# Patient Record
Sex: Female | Born: 1975 | Race: White | Hispanic: No | Smoking: Former smoker
Health system: Southern US, Community
[De-identification: ages and names within clinical notes are randomized; demographics above are authoritative.]

## PROBLEM LIST (undated history)

## (undated) DIAGNOSIS — F32A Depression, unspecified: Secondary | ICD-10-CM

## (undated) DIAGNOSIS — F329 Major depressive disorder, single episode, unspecified: Secondary | ICD-10-CM

## (undated) DIAGNOSIS — F250 Schizoaffective disorder, bipolar type: Secondary | ICD-10-CM

## (undated) HISTORY — PX: TONSILLECTOMY AND ADENOIDECTOMY: SUR1326

## (undated) HISTORY — PX: OVARIAN CYST REMOVAL: SHX89

## (undated) HISTORY — PX: APPENDECTOMY: SHX54

---

## 1999-09-17 HISTORY — PX: CHOLECYSTECTOMY: SHX55

## 2010-04-09 ENCOUNTER — Emergency Department (HOSPITAL_COMMUNITY): Admission: EM | Admit: 2010-04-09 | Discharge: 2010-04-10 | Payer: Self-pay | Admitting: Emergency Medicine

## 2010-05-30 ENCOUNTER — Emergency Department (HOSPITAL_COMMUNITY): Admission: EM | Admit: 2010-05-30 | Discharge: 2010-05-30 | Payer: Self-pay | Admitting: Emergency Medicine

## 2010-05-31 ENCOUNTER — Emergency Department (HOSPITAL_COMMUNITY): Admission: EM | Admit: 2010-05-31 | Discharge: 2010-06-01 | Payer: Self-pay | Admitting: Emergency Medicine

## 2010-06-01 ENCOUNTER — Ambulatory Visit: Payer: Self-pay | Admitting: Psychiatry

## 2010-06-01 ENCOUNTER — Inpatient Hospital Stay (HOSPITAL_COMMUNITY): Admission: AD | Admit: 2010-06-01 | Discharge: 2010-06-05 | Payer: Self-pay | Admitting: Psychiatry

## 2010-11-29 LAB — URINALYSIS, ROUTINE W REFLEX MICROSCOPIC
Ketones, ur: 15 mg/dL — AB
Ketones, ur: 15 mg/dL — AB
Nitrite: NEGATIVE
Protein, ur: 30 mg/dL — AB
Protein, ur: 30 mg/dL — AB
Urobilinogen, UA: 1 mg/dL (ref 0.0–1.0)
Urobilinogen, UA: 1 mg/dL (ref 0.0–1.0)

## 2010-11-29 LAB — POCT I-STAT, CHEM 8
BUN: 11 mg/dL (ref 6–23)
Calcium, Ion: 1.02 mmol/L — ABNORMAL LOW (ref 1.12–1.32)
Creatinine, Ser: 1 mg/dL (ref 0.4–1.2)
TCO2: 24 mmol/L (ref 0–100)

## 2010-11-29 LAB — DIFFERENTIAL
Basophils Absolute: 0 10*3/uL (ref 0.0–0.1)
Basophils Absolute: 0 10*3/uL (ref 0.0–0.1)
Basophils Relative: 0 % (ref 0–1)
Eosinophils Absolute: 0 10*3/uL (ref 0.0–0.7)
Eosinophils Absolute: 0 10*3/uL (ref 0.0–0.7)
Eosinophils Relative: 0 % (ref 0–5)
Lymphocytes Relative: 11 % — ABNORMAL LOW (ref 12–46)
Lymphs Abs: 1.5 10*3/uL (ref 0.7–4.0)
Monocytes Absolute: 0.8 10*3/uL (ref 0.1–1.0)
Monocytes Relative: 4 % (ref 3–12)
Neutrophils Relative %: 81 % — ABNORMAL HIGH (ref 43–77)
Neutrophils Relative %: 92 % — ABNORMAL HIGH (ref 43–77)

## 2010-11-29 LAB — CBC
HCT: 47.8 % — ABNORMAL HIGH (ref 36.0–46.0)
Hemoglobin: 16.6 g/dL — ABNORMAL HIGH (ref 12.0–15.0)
Hemoglobin: 16.6 g/dL — ABNORMAL HIGH (ref 12.0–15.0)
MCH: 30.2 pg (ref 26.0–34.0)
MCH: 30.6 pg (ref 26.0–34.0)
MCH: 31.2 pg (ref 26.0–34.0)
MCHC: 34.7 g/dL (ref 30.0–36.0)
MCHC: 34.3 g/dL (ref 30.0–36.0)
MCHC: 35.9 g/dL (ref 30.0–36.0)
MCV: 86.9 fL (ref 78.0–100.0)
Platelets: 172 10*3/uL (ref 150–400)
RBC: 5.5 MIL/uL — ABNORMAL HIGH (ref 3.87–5.11)
RDW: 12.8 % (ref 11.5–15.5)
RDW: 12.7 % (ref 11.5–15.5)

## 2010-11-29 LAB — COMPREHENSIVE METABOLIC PANEL
AST: 38 U/L — ABNORMAL HIGH (ref 0–37)
AST: 39 U/L — ABNORMAL HIGH (ref 0–37)
Albumin: 3.9 g/dL (ref 3.5–5.2)
Alkaline Phosphatase: 53 U/L (ref 39–117)
CO2: 26 mEq/L (ref 19–32)
CO2: 27 mEq/L (ref 19–32)
Calcium: 9.5 mg/dL (ref 8.4–10.5)
Chloride: 106 mEq/L (ref 96–112)
Chloride: 99 mEq/L (ref 96–112)
Creatinine, Ser: 1.13 mg/dL (ref 0.4–1.2)
GFR calc Af Amer: >60 mL/min (ref >60)
GFR calc non Af Amer: 55 mL/min — ABNORMAL LOW (ref >60)
GFR calc non Af Amer: >60 mL/min (ref >60)
Glucose, Bld: 106 mg/dL — ABNORMAL HIGH (ref 70–99)
Potassium: 2.9 mEq/L — ABNORMAL LOW (ref 3.5–5.1)
Total Bilirubin: 0.8 mg/dL (ref 0.3–1.2)
Total Bilirubin: 1 mg/dL (ref 0.3–1.2)

## 2010-11-29 LAB — RAPID URINE DRUG SCREEN, HOSP PERFORMED
Amphetamines: NOT DETECTED
Amphetamines: NOT DETECTED
Barbiturates: NOT DETECTED
Benzodiazepines: POSITIVE — AB
Benzodiazepines: NOT DETECTED
Cocaine: UNDETERMINED — AB
Cocaine: POSITIVE — AB
Opiates: NOT DETECTED
Opiates: UNDETERMINED — AB
Opiates: NOT DETECTED
Tetrahydrocannabinol: UNDETERMINED — AB
Tetrahydrocannabinol: NOT DETECTED

## 2010-11-29 LAB — URINE MICROSCOPIC-ADD ON

## 2010-11-29 LAB — TSH: TSH: 1.405 u[IU]/mL (ref 0.350–4.500)

## 2010-11-29 LAB — PROTIME-INR: INR: 1.04 (ref 0.00–1.49)

## 2010-11-29 LAB — POCT PREGNANCY, URINE: Preg Test, Ur: NEGATIVE

## 2010-12-01 LAB — CBC
Hemoglobin: 13.8 g/dL (ref 12.0–15.0)
MCH: 30.9 pg (ref 26.0–34.0)
MCHC: 33.8 g/dL (ref 30.0–36.0)
MCV: 91.3 fL (ref 78.0–100.0)

## 2010-12-01 LAB — ETHANOL: Alcohol, Ethyl (B): 30 mg/dL — ABNORMAL HIGH (ref 0–10)

## 2010-12-01 LAB — BASIC METABOLIC PANEL
CO2: 24 mEq/L (ref 19–32)
Calcium: 9.2 mg/dL (ref 8.4–10.5)
Creatinine, Ser: 0.77 mg/dL (ref 0.4–1.2)
Glucose, Bld: 106 mg/dL — ABNORMAL HIGH (ref 70–99)
Sodium: 140 mEq/L (ref 135–145)

## 2010-12-01 LAB — DIFFERENTIAL
Basophils Relative: 0 % (ref 0–1)
Eosinophils Absolute: 0.1 10*3/uL (ref 0.0–0.7)
Eosinophils Relative: 1 % (ref 0–5)
Monocytes Relative: 6 % (ref 3–12)
Neutrophils Relative %: 82 % — ABNORMAL HIGH (ref 43–77)

## 2010-12-01 LAB — RAPID URINE DRUG SCREEN, HOSP PERFORMED
Amphetamines: NOT DETECTED
Tetrahydrocannabinol: POSITIVE — AB

## 2011-03-06 ENCOUNTER — Inpatient Hospital Stay (HOSPITAL_COMMUNITY)
Admission: AD | Admit: 2011-03-06 | Discharge: 2011-03-06 | Disposition: A | Discharge status: Home / Self Care | Payer: Self-pay | Source: Ambulatory Visit | Attending: Obstetrics & Gynecology | Admitting: Obstetrics & Gynecology

## 2011-03-06 ENCOUNTER — Inpatient Hospital Stay (HOSPITAL_COMMUNITY): Payer: Self-pay

## 2011-03-06 DIAGNOSIS — Z79899 Other long term (current) drug therapy: Secondary | ICD-10-CM | POA: Insufficient documentation

## 2011-03-06 DIAGNOSIS — O209 Hemorrhage in early pregnancy, unspecified: Secondary | ICD-10-CM | POA: Insufficient documentation

## 2011-03-06 DIAGNOSIS — F319 Bipolar disorder, unspecified: Secondary | ICD-10-CM | POA: Insufficient documentation

## 2011-03-06 DIAGNOSIS — O99340 Other mental disorders complicating pregnancy, unspecified trimester: Secondary | ICD-10-CM | POA: Insufficient documentation

## 2011-03-06 LAB — URINALYSIS, ROUTINE W REFLEX MICROSCOPIC
Bilirubin Urine: NEGATIVE
Glucose, UA: NEGATIVE mg/dL
Ketones, ur: NEGATIVE mg/dL
Nitrite: NEGATIVE
pH: 6.5 (ref 5.0–8.0)

## 2011-03-06 LAB — HCG, QUANTITATIVE, PREGNANCY: hCG, Beta Chain, Quant, S: 5027 m[IU]/mL — ABNORMAL HIGH (ref <5)

## 2011-03-06 LAB — CBC
Hemoglobin: 12.8 g/dL (ref 12.0–15.0)
Platelets: 178 10*3/uL (ref 150–400)
RBC: 4.32 MIL/uL (ref 3.87–5.11)
WBC: 9.2 10*3/uL (ref 4.0–10.5)

## 2011-03-06 LAB — WET PREP, GENITAL
Trich, Wet Prep: NONE SEEN
Yeast Wet Prep HPF POC: NONE SEEN

## 2011-03-06 LAB — POCT PREGNANCY, URINE: Preg Test, Ur: POSITIVE

## 2011-03-06 LAB — URINE MICROSCOPIC-ADD ON

## 2011-03-09 ENCOUNTER — Inpatient Hospital Stay (HOSPITAL_COMMUNITY)
Admission: AD | Admit: 2011-03-09 | Discharge: 2011-03-09 | Disposition: A | Discharge status: Home / Self Care | Payer: Self-pay | Source: Ambulatory Visit | Attending: Obstetrics & Gynecology | Admitting: Obstetrics & Gynecology

## 2011-03-09 DIAGNOSIS — O039 Complete or unspecified spontaneous abortion without complication: Secondary | ICD-10-CM

## 2011-03-09 DIAGNOSIS — F3130 Bipolar disorder, current episode depressed, mild or moderate severity, unspecified: Secondary | ICD-10-CM

## 2011-03-09 DIAGNOSIS — M542 Cervicalgia: Secondary | ICD-10-CM

## 2011-03-28 ENCOUNTER — Other Ambulatory Visit: Payer: Self-pay

## 2011-04-11 ENCOUNTER — Ambulatory Visit: Payer: Self-pay | Admitting: Obstetrics and Gynecology

## 2011-11-22 ENCOUNTER — Emergency Department (HOSPITAL_COMMUNITY): Payer: Self-pay

## 2011-11-22 ENCOUNTER — Emergency Department (HOSPITAL_COMMUNITY)
Admission: EM | Admit: 2011-11-22 | Discharge: 2011-11-22 | Disposition: A | Discharge status: Home / Self Care | Payer: Self-pay | Attending: Emergency Medicine | Admitting: Emergency Medicine

## 2011-11-22 ENCOUNTER — Encounter (HOSPITAL_COMMUNITY): Payer: Self-pay | Admitting: Emergency Medicine

## 2011-11-22 DIAGNOSIS — S9032XA Contusion of left foot, initial encounter: Secondary | ICD-10-CM

## 2011-11-22 DIAGNOSIS — IMO0002 Reserved for concepts with insufficient information to code with codable children: Secondary | ICD-10-CM | POA: Insufficient documentation

## 2011-11-22 DIAGNOSIS — S9030XA Contusion of unspecified foot, initial encounter: Secondary | ICD-10-CM | POA: Insufficient documentation

## 2011-11-22 HISTORY — DX: Major depressive disorder, single episode, unspecified: F32.9

## 2011-11-22 HISTORY — DX: Depression, unspecified: F32.A

## 2011-11-22 MED ORDER — OXYCODONE-ACETAMINOPHEN 5-325 MG PO TABS
2.0000 | ORAL_TABLET | Freq: Once | ORAL | Status: AC
  Administered 2011-11-22: 2 via ORAL
  Filled 2011-11-22: qty 2

## 2011-11-22 MED ORDER — OXYCODONE-ACETAMINOPHEN 5-325 MG PO TABS: 1.0000 | ORAL_TABLET | Freq: Four times a day (QID) | ORAL | Status: AC | PRN

## 2011-11-22 MED ORDER — BACITRACIN 500 UNIT/GM EX OINT: 1.0000 "application " | TOPICAL_OINTMENT | Freq: Once | CUTANEOUS | Status: DC

## 2011-11-22 MED ORDER — BACITRACIN-NEOMYCIN-POLYMYXIN 400-5-5000 EX OINT
TOPICAL_OINTMENT | CUTANEOUS | Status: AC
  Filled 2011-11-22: qty 2

## 2011-11-22 NOTE — Discharge Instructions (Signed)
Contusion A contusion is a deep bruise. Contusions are the result of an injury that caused bleeding under the skin. The contusion may turn blue, purple, or yellow. Minor injuries will give you a painless contusion, but more severe contusions may stay painful and swollen for a few weeks.  CAUSES  A contusion is usually caused by a blow, trauma, or direct force to an area of the body. SYMPTOMS   Swelling and redness of the injured area.   Bruising of the injured area.   Tenderness and soreness of the injured area.   Pain.  DIAGNOSIS  The diagnosis can be made by taking a history and physical exam. An X-ray, CT scan, or MRI may be needed to determine if there were any associated injuries, such as fractures. TREATMENT  Specific treatment will depend on what area of the body was injured. In general, the best treatment for a contusion is resting, icing, elevating, and applying cold compresses to the injured area. Over-the-counter medicines may also be recommended for pain control. Ask your caregiver what the best treatment is for your contusion. HOME CARE INSTRUCTIONS   Put ice on the injured area.   Put ice in a plastic bag.   Place a towel between your skin and the bag.   Leave the ice on for 15 to 20 minutes, 3 to 4 times a day.   Only take over-the-counter or prescription medicines for pain, discomfort, or fever as directed by your caregiver. Your caregiver may recommend avoiding anti-inflammatory medicines (aspirin, ibuprofen, and naproxen) for 48 hours because these medicines may increase bruising.   Rest the injured area.   If possible, elevate the injured area to reduce swelling.  SEEK IMMEDIATE MEDICAL CARE IF:   You have increased bruising or swelling.   You have pain that is getting worse.   Your swelling or pain is not relieved with medicines.  MAKE SURE YOU:   Understand these instructions.   Will watch your condition.   Will get help right away if you are not  doing well or get worse.  Document Released: 06/12/2005 Document Revised: 08/22/2011 Document Reviewed: 07/08/2011 [employer] Patient Information 2012 [employer].Contusion (Bruise) of Foot Injury to the foot causes bruises (contusions). Contusions are caused by bleeding from small blood vessels that allow blood to leak out into the muscles, cord-like structures that attach muscle to bone (tendons), and/or other soft tissue.  CAUSES  Contusions of the foot are common. Bruises are frequently seen from:  Contact sports injuries.   The use of medications that thin the blood (anti-coagulants).   Aspirin and non-steroidal anti-inflammatory agents that decrease the clotting ability.   People with vitamin deficiencies.  SYMPTOMS  Signs of foot injury include pain and swelling. At first there may be discoloration from blood under the skin. This will appear blue to purple in color. As the bruise ages, the color turns yellow. Swelling may limit the movement of the toes.  Complications from foot injury may include:  Collections of blood leading to disability if calcium deposits form. These can later limit movement in the foot.   Infection of the foot if there are breaks in the skin.   Rupture of the tendons that may need surgical repair.  DIAGNOSIS  Diagnosing foot injuries can be made by observation. If problems continue, X-rays may be needed to make sure there are no broken bones (fractures). Continuing problems may require physical therapy.  HOME CARE INSTRUCTIONS   Apply ice to the injury for   15 to 20 minutes, 3 to 4 times per day. Put the ice in a plastic bag and place a towel between the bag of ice and your skin.   An elastic wrap (like an Ace bandage) may be used to keep swelling down.   Keep foot elevated to reduce swelling and discomfort.   Try to avoid standing or walking while the foot is painful. Do not resume use until instructed by your caregiver. Then begin use gradually. If  pain develops, decrease use and continue the above measures. Gradually increase activities that do not cause discomfort until you slowly have normal use.   Only take over-the-counter or prescription medicines for pain, discomfort, or fever as directed by your caregiver. Use only if your caregiver has not given medications that would interfere.   Begin daily rehabilitation exercises when supportive wrapping is no longer needed.   Use ice massage for 10 minutes before and after workouts. Fill a large styrofoam cup with water and freeze. Tear a small amount of foam from the top so ice protrudes. Massage ice firmly over the injured area in a circle about the size of a softball.   Always eat a well balanced diet.   Follow all instructions for follow up with your caregiver, any orthopedic referrals, physical therapy and rehabilitation. Any delay in obtaining necessary care could result in delayed healing, and temporary or permanent disability.  SEEK IMMEDIATE MEDICAL CARE IF:   Your pain and swelling increase, or pain is uncontrolled with medications.   You have loss of feeling in your foot, or your foot turns cold or blue.   An oral temperature above 102 F (38.9 C) develops, not controlled by medication.   Your foot becomes warm to touch, or you have more pain with movement of your toes.   You have a foot contusion that does not improve in 1 or 2 days.   Skin is broken and signs of infection occur (drainage, increasing pain, fever, headache, muscle aches, dizziness or a general ill feeling).   You develop new, unexplained symptoms, or an increase of the symptoms that brought you to your caregiver.  MAKE SURE YOU:   Understand these instructions.   Will watch your condition.   Will get help right away if you are not doing well or get worse.  Document Released: 06/24/2006 Document Revised: 08/22/2011 Document Reviewed: 08/06/2011 [employer] Patient Information 2012 [employer]. 

## 2011-11-22 NOTE — ED Notes (Signed)
Patient returned from X-ray 

## 2011-11-22 NOTE — ED Notes (Signed)
MD at bedside. Dr. Pickering at bedside.  
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
## 2011-11-22 NOTE — ED Notes (Signed)
Wounds dressing with neosporin and band-aids.
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
## 2011-12-05 ENCOUNTER — Emergency Department (HOSPITAL_COMMUNITY)
Admission: EM | Admit: 2011-12-05 | Discharge: 2011-12-05 | Disposition: A | Discharge status: Home / Self Care | Payer: Self-pay | Attending: Emergency Medicine | Admitting: Emergency Medicine

## 2011-12-05 ENCOUNTER — Encounter (HOSPITAL_COMMUNITY): Payer: Self-pay

## 2011-12-05 DIAGNOSIS — F329 Major depressive disorder, single episode, unspecified: Secondary | ICD-10-CM | POA: Insufficient documentation

## 2011-12-05 DIAGNOSIS — R221 Localized swelling, mass and lump, neck: Secondary | ICD-10-CM | POA: Insufficient documentation

## 2011-12-05 DIAGNOSIS — Z79899 Other long term (current) drug therapy: Secondary | ICD-10-CM | POA: Insufficient documentation

## 2011-12-05 DIAGNOSIS — F3289 Other specified depressive episodes: Secondary | ICD-10-CM | POA: Insufficient documentation

## 2011-12-05 DIAGNOSIS — R220 Localized swelling, mass and lump, head: Secondary | ICD-10-CM | POA: Insufficient documentation

## 2011-12-05 DIAGNOSIS — R51 Headache: Secondary | ICD-10-CM | POA: Insufficient documentation

## 2011-12-05 DIAGNOSIS — K0889 Other specified disorders of teeth and supporting structures: Secondary | ICD-10-CM

## 2011-12-05 DIAGNOSIS — K089 Disorder of teeth and supporting structures, unspecified: Secondary | ICD-10-CM | POA: Insufficient documentation

## 2011-12-05 MED ORDER — CLINDAMYCIN HCL 150 MG PO CAPS: 150.0000 mg | ORAL_CAPSULE | Freq: Four times a day (QID) | ORAL | Status: AC

## 2011-12-05 MED ORDER — HYDROCODONE-ACETAMINOPHEN 7.5-500 MG/15ML PO SOLN: 15.0000 mL | Freq: Four times a day (QID) | ORAL | Status: AC | PRN

## 2011-12-05 NOTE — Discharge Instructions (Signed)
Dental Pain  A tooth ache may be caused by cavities (tooth decay). Cavities expose the nerve of the tooth to air and hot or cold temperatures. It may come from an infection or abscess (also called a boil or furuncle) around your tooth. It is also often caused by dental caries (tooth decay). This causes the pain you are having.  DIAGNOSIS   Your caregiver can diagnose this problem by exam.  TREATMENT   · If caused by an infection, it may be treated with medications which kill germs (antibiotics) and pain medications as prescribed by your caregiver. Take medications as directed.  · Only take over-the-counter or prescription medicines for pain, discomfort, or fever as directed by your caregiver.  · Whether the tooth ache today is caused by infection or dental disease, you should see your dentist as soon as possible for further care.  SEEK MEDICAL CARE IF:  The exam and treatment you received today has been provided on an emergency basis only. This is not a substitute for complete medical or dental care. If your problem worsens or new problems (symptoms) appear, and you are unable to meet with your dentist, call or return to this location.  SEEK IMMEDIATE MEDICAL CARE IF:   · You have a fever.  · You develop redness and swelling of your face, jaw, or neck.  · You are unable to open your mouth.  · You have severe pain uncontrolled by pain medicine.  MAKE SURE YOU:   · Understand these instructions.  · Will watch your condition.  · Will get help right away if you are not doing well or get worse.  Document Released: 09/02/2005 Document Revised: 08/22/2011 Document Reviewed: 04/20/2008  [employer]® Patient Information ©2012 [employer].

## 2011-12-05 NOTE — ED Provider Notes (Signed)
History     CSN: [id number]  Arrival date & time 12/05/11  1246   First MD Initiated Contact with Patient 12/05/11 1419      Chief Complaint  Patient presents with  . Dental Pain    (Consider location/radiation/quality/duration/timing/severity/associated sxs/prior treatment) HPI  36 year old female, presents with dental pain. Sts she has partial dentals to her front teeth.  For the past few days she has been having pain to R upper canine adjacent to her partials.  Describe pain as throbbing, radiating up to R face, and worsen with eating or drinking.  Notice swelling in gum.  Also notice a blister to upper lip that is tender.  This has been ongoing for the past few days at well.  Denies fever, neck stiffness, throat swelling.  Denies cp, sob, n/v/d.  Denies any recent trauma.    Past Medical History  Diagnosis Date  . Depression     Past Surgical History  Procedure Date  . Cholecystectomy   . Appendectomy     No family history on file.  History  Substance Use Topics  . Smoking status: Never Smoker   . Smokeless tobacco: Not on file  . Alcohol Use: No    OB History    Grav Para Term Preterm Abortions TAB SAB Ect Mult Living                  Review of Systems  All other systems reviewed and are negative.    Allergies  Ativan; Penicillins; and Rocephin  Home Medications   Current Outpatient Rx  Name Route Sig Dispense Refill  . GABAPENTIN 400 MG PO CAPS Oral Take 400 mg by mouth 2 (two) times daily.    . IBUPROFEN 200 MG PO TABS Oral Take 400 mg by mouth every 6 (six) hours as needed. Pain    . ILOPERIDONE 2 MG PO TABS Oral Take 2 mg by mouth 2 (two) times daily.    Marland Kitchen OMEPRAZOLE 20 MG PO CPDR Oral Take 20 mg by mouth daily.    . SERTRALINE HCL 100 MG PO TABS Oral Take 50 mg by mouth daily. Pt says she takes half a tablet daily    . TRAZODONE HCL 150 MG PO TABS Oral Take 150 mg by mouth at bedtime.      BP 140/86  Pulse 108  Temp(Src) 98.3 F (36.8 C)  (Oral)  Resp 20  SpO2 100%  LMP 11/15/2011  Physical Exam  Nursing note and vitals reviewed. Constitutional: She appears well-developed and well-nourished. No distress.  HENT:  Head: Atraumatic.  Mouth/Throat: Uvula is midline, oropharynx is clear and moist and mucous membranes are normal.    Eyes: Conjunctivae are normal.  Neck: Normal range of motion. Neck supple.  Lymphadenopathy:    She has no cervical adenopathy.  Neurological: She is alert.  Skin: Skin is warm.    ED Course  Procedures (including critical care time)  Labs Reviewed - No data to display No results found.   No diagnosis found.    MDM  Dental pain, will prescribe clindamycin and pain medication.  Pt agrees to f/u with her dentist, Dr. Helmut Muster.  Pt mildly tachycardic secondary to pain.          Fayrene Helper, PA-C 12/05/11 1440

## 2011-12-05 NOTE — ED Provider Notes (Signed)
Medical screening examination/treatment/procedure(s) were performed by non-physician practitioner and as supervising physician I was immediately available for consultation/collaboration.   Lyanne Co, MD 12/05/11 2245

## 2011-12-05 NOTE — ED Notes (Signed)
Pt c/o upper rt tooth abscess with facial swelling

## 2011-12-23 ENCOUNTER — Emergency Department (HOSPITAL_COMMUNITY): Payer: Self-pay

## 2011-12-23 ENCOUNTER — Emergency Department (HOSPITAL_COMMUNITY)
Admission: EM | Admit: 2011-12-23 | Discharge: 2011-12-23 | Disposition: A | Discharge status: Home / Self Care | Payer: Self-pay | Attending: Emergency Medicine | Admitting: Emergency Medicine

## 2011-12-23 ENCOUNTER — Encounter (HOSPITAL_COMMUNITY): Payer: Self-pay

## 2011-12-23 DIAGNOSIS — T148XXA Other injury of unspecified body region, initial encounter: Secondary | ICD-10-CM

## 2011-12-23 DIAGNOSIS — F329 Major depressive disorder, single episode, unspecified: Secondary | ICD-10-CM | POA: Insufficient documentation

## 2011-12-23 DIAGNOSIS — Z79899 Other long term (current) drug therapy: Secondary | ICD-10-CM | POA: Insufficient documentation

## 2011-12-23 DIAGNOSIS — Z9889 Other specified postprocedural states: Secondary | ICD-10-CM | POA: Insufficient documentation

## 2011-12-23 DIAGNOSIS — M25559 Pain in unspecified hip: Secondary | ICD-10-CM | POA: Insufficient documentation

## 2011-12-23 DIAGNOSIS — W19XXXA Unspecified fall, initial encounter: Secondary | ICD-10-CM | POA: Insufficient documentation

## 2011-12-23 DIAGNOSIS — F3289 Other specified depressive episodes: Secondary | ICD-10-CM | POA: Insufficient documentation

## 2011-12-23 LAB — URINALYSIS, ROUTINE W REFLEX MICROSCOPIC
Bilirubin Urine: NEGATIVE
Hgb urine dipstick: NEGATIVE
Protein, ur: NEGATIVE mg/dL
Urobilinogen, UA: 1 mg/dL (ref 0.0–1.0)

## 2011-12-23 MED ORDER — OXYCODONE-ACETAMINOPHEN 5-325 MG PO TABS
2.0000 | ORAL_TABLET | Freq: Once | ORAL | Status: AC
  Administered 2011-12-23: 2 via ORAL
  Filled 2011-12-23: qty 2

## 2011-12-23 MED ORDER — HYDROCODONE-ACETAMINOPHEN 5-325 MG PO TABS: 1.0000 | ORAL_TABLET | ORAL | Status: AC | PRN

## 2011-12-23 MED ORDER — METHOCARBAMOL 500 MG PO TABS: 500.0000 mg | ORAL_TABLET | Freq: Two times a day (BID) | ORAL | Status: AC

## 2011-12-23 NOTE — ED Notes (Signed)
Pt fell down steps injuring her right hip, unable to bear weight on that side
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
## 2011-12-23 NOTE — ED Notes (Signed)
Patient transported to X-ray 

## 2011-12-23 NOTE — Discharge Instructions (Signed)
Your xray was negative for any fractures (broken bones). This is likely a muscular injury. Use your home ibuprofen for pain along with the muscle relaxer (you may take up to 2 tablets twice daily). You've been given a small prescription for Norco - take this only if needed if your other meds aren't helping. Apply ice to the area. If you are not improving, please make a follow up appointment with Dr. Otelia Sergeant. Return to the ER with numbness, weakness, inability to walk, or other worrisome symptoms.  RESOURCE GUIDE  Dental Problems  Patients with Medicaid: [facility] [phone] [address]                                           [phone] [address] Phone:  [phone]                                                  Phone:  [phone]  If unable to pay or uninsured, contact:  [facility] or [facility]. to become qualified for the adult dental clinic.  Chronic Pain Problems Contact [facility]  [phone] Patients need to be referred by their primary care doctor.  Insufficient Money for Medicine Contact [employer]:  call "211" or [facility] 716-474-5393.  No Primary Care Doctor Call [facility]  [phone] Other agencies that provide inexpensive medical care    [facility]  [phone]    [facility]  [phone]    [facility]  [phone]    [facility]  [phone]    [facility]  [phone]    [facility]  6571859827  Psychological Services [facility]  [phone] [facility] Services  [phone] [facility]   [phone] (emergency services [phone])  Substance Abuse Resources [facility]  [phone] [facility] (646)530-9071 [facility] [phone] Floyd[facility] Flock (581)044-2093 Residential & Outpatient Substance Abuse Program  430-602-2828  Abuse/Neglect [facility] Child Abuse  Hotline 514-079-6182 [facility] Child Abuse Hotline (605)226-0625 (After Hours)  Emergency Shelter [facility] (845) [phone]  M[facility] Homes [facility] [phone] [facility] 5391730034  MRSA Hotline #:   7796605564    [facility] Resources  [facility]     [employer]                          [facility] Dept. [address] [place]                       [address]      [address]  Patrecia Pace  Michell Heinrich Phone:  [phone]                                   Phone:  [phone]                 Phone:  [phone]  [name] Phone:  [phone]  [facility] Child Abuse Hotline 815-297-8351 9703115501 (After Hours)  Strain A strain is an injury to a muscle or the tissue that connects muscles to bones (tendon). In a strain injury, the muscle or tendon is either stretched or torn. Muscles are more susceptible to strains if they cross two joints, such as:  Hamstrings.   Quadriceps.   Calves.   Biceps.  There are three categories of strains:  A first-degree strain is a small tear in the muscle. There is no lengthening of the muscle, but pain may be present with contraction of the muscle.   A second-degree strain is a small tear in the muscle accompanied by lengthening of the muscle. Muscles with a second-degree strain are still able to function.   A third-degree strain is a complete tear of the muscle. Muscles with a third-degree strain cannot function properly.  Strains often have bleeding and bruising within the muscle. SYMPTOMS   Pain, tenderness, redness or bruising, and swelling in the area of injury.   Loss of normal mobility of the injured joint.  CAUSES  A sudden force exerted on a muscle or tendon that it cannot withstand usually causes  strains. This may be due to a sudden overload of a contracted muscle, overuse, or sudden increase or change in activity.  RISK INCREASES WITH:  Trauma.   Poor strength and flexibility.   Failure to warm-up properly before activity.   Return to activity before healing is complete.  PREVENTION  Warm-up and stretch properly before and activity.   Maintain physical fitness:   Joint flexibility.   Muscle strength.   Endurance and conditioning.   Strengthen weak muscles with exercises to prevent recurrence.  PROGNOSIS  If treated properly, strains are usually curable. The time it takes to recover is related to the severity of the injury and usually varies from 2 to 8 weeks. RELATED COMPLICATIONS   Re-injury or recurrence of symptoms, permanent weakness.   Joint stiffness if the strain is severe and rehabilitation is incomplete.   Delayed healing or resolution of symptoms if sports are resumed before rehabilitation is complete.   Excessive bleeding into muscle, especially if taking anti-inflammatory medicines. This can lead to delayed recovery and injury to nerves, muscle, and blood vessels; this is an emergency.  TREATMENT  Treatment initially involves ice and medicine to help reduce pain and inflammation. Use of the affected muscle should be limited by a:  Brace.   Elastic bandage wrapping.   Splint.   Cast.   Sling.  Strengthening and stretching exercises may be necessary after immobilization to prevent joint stiffness. These exercises may be completed at home or with a therapist. If the tendon is torn, then surgery may be necessary to repair it.  MEDICATION   Avoid aspirin or ibuprofen in the first 48 hours after the injury. These medicines may increase the tendency to bleed. During this time, you may take pain relievers, such as acetaminophen, that do not affect bleeding.   After the first 48 hours, if pain medicine is necessary, then nonsteroidal anti-inflammatory  medicines, such as  aspirin and ibuprofen, or other minor pain relievers, such as acetaminophen, are often recommended.   Do not take pain medicine within 7 days before surgery.   Prescription pain relievers may be prescribed. Use only as directed and only as much as you need   Ointments applied to the skin may be helpful.  HEAT AND COLD  Cold treatment (icing) relieves pain and reduces inflammation. Cold treatment should be applied for 10 to 15 minutes every 2 to 3 hours for inflammation and pain and immediately after any activity that aggravates your symptoms. Use ice packs or massage the area with a piece of ice (ice massage).   Heat treatment may be used prior to performing the stretching and strengthening activities prescribed by your caregiver, physical therapist, or athletic trainer. Use a heat pack or soak your injury in warm water.  SEEK MEDICAL CARE IF:   Symptoms get worse or do not improve despite treatment.   Pain becomes intolerable.   You experience numbness or tingling.   Toes or fingernails become cold or develop a blue, gray, or dusky color.   New, unexplained symptoms develop (drugs used in treatment may produce side effects).  Document Released: 09/02/2005 Document Revised: 08/22/2011 Document Reviewed: 12/15/2008 [facility] Patient Information 2012 [place], [place].

## 2011-12-24 ENCOUNTER — Emergency Department (HOSPITAL_COMMUNITY)
Admission: EM | Admit: 2011-12-24 | Discharge: 2011-12-24 | Disposition: A | Discharge status: Home / Self Care | Payer: Self-pay | Attending: Emergency Medicine | Admitting: Emergency Medicine

## 2011-12-24 DIAGNOSIS — M79609 Pain in unspecified limb: Secondary | ICD-10-CM | POA: Insufficient documentation

## 2011-12-24 DIAGNOSIS — F329 Major depressive disorder, single episode, unspecified: Secondary | ICD-10-CM | POA: Insufficient documentation

## 2011-12-24 DIAGNOSIS — F3289 Other specified depressive episodes: Secondary | ICD-10-CM | POA: Insufficient documentation

## 2011-12-24 DIAGNOSIS — M79659 Pain in unspecified thigh: Secondary | ICD-10-CM

## 2011-12-24 MED ORDER — OXYCODONE-ACETAMINOPHEN 5-325 MG PO TABS: 1.0000 | ORAL_TABLET | Freq: Four times a day (QID) | ORAL | Status: AC | PRN

## 2011-12-24 MED ORDER — HYDROMORPHONE HCL PF 1 MG/ML IJ SOLN
1.0000 mg | Freq: Once | INTRAMUSCULAR | Status: AC
  Administered 2011-12-24: 1 mg via INTRAMUSCULAR
  Filled 2011-12-24: qty 1

## 2011-12-24 NOTE — ED Notes (Signed)
Reviewed d/c instructions and Rx for Percocet.

## 2011-12-24 NOTE — ED Provider Notes (Signed)
History     CSN: [id number]  Arrival date & time 12/24/11  1310   First MD Initiated Contact with Patient 12/24/11 1355      Chief Complaint  Patient presents with  . Leg Injury    (Consider location/radiation/quality/duration/timing/severity/associated sxs/prior treatment) Patient is a 36 y.o. female presenting with leg pain.  Leg Pain  The incident occurred 2 days ago. The incident occurred at home. The injury mechanism was a fall. The pain is present in the right hip and right thigh. The quality of the pain is described as throbbing. The pain is severe. The pain has been fluctuating since onset. Pertinent negatives include no numbness, no inability to bear weight, no loss of motion, no muscle weakness and no loss of sensation. She reports no foreign bodies present. The symptoms are aggravated by activity, bearing weight and palpation. The treatment provided no relief.    Past Medical History  Diagnosis Date  . Depression     Past Surgical History  Procedure Date  . Cholecystectomy   . Appendectomy     No family history on file.  History  Substance Use Topics  . Smoking status: Never Smoker   . Smokeless tobacco: Not on file  . Alcohol Use: No    OB History    Grav Para Term Preterm Abortions TAB SAB Ect Mult Living                  Review of Systems  Musculoskeletal: Positive for myalgias.  Neurological: Negative for numbness.  All other systems reviewed and are negative.    Allergies  Penicillins; Rocephin; and Ativan  Home Medications   Current Outpatient Rx  Name Route Sig Dispense Refill  . ALPRAZOLAM 0.25 MG PO TABS Oral Take 0.25 mg by mouth at bedtime as needed. For anxiety/sleep.    Marland Kitchen GABAPENTIN 400 MG PO CAPS Oral Take 400 mg by mouth 2 (two) times daily.    Marland Kitchen HYDROCODONE-ACETAMINOPHEN 5-325 MG PO TABS Oral Take 1 tablet by mouth every 4 (four) hours as needed for pain. 10 tablet 0  . IBUPROFEN 800 MG PO TABS Oral Take 800 mg by mouth 3  (three) times daily. Pain.    . ILOPERIDONE 2 MG PO TABS Oral Take 2 mg by mouth 2 (two) times daily.    Marland Kitchen METHOCARBAMOL 500 MG PO TABS Oral Take 1 tablet (500 mg total) by mouth 2 (two) times daily. 20 tablet 0  . OMEPRAZOLE 20 MG PO CPDR Oral Take 20 mg by mouth daily.    . TRAZODONE HCL 150 MG PO TABS Oral Take 150 mg by mouth at bedtime.      BP 121/69  Pulse 113  Temp(Src) 98.2 F (36.8 C) (Oral)  Resp 22  SpO2 96%  LMP 12/16/2011  Physical Exam  Nursing note and vitals reviewed. Constitutional: She is oriented to person, place, and time. She appears well-developed and well-nourished.  HENT:  Head: Normocephalic and atraumatic.  Eyes: Pupils are equal, round, and reactive to light.  Neck: Normal range of motion. Neck supple.  Cardiovascular: Normal rate, regular rhythm, normal heart sounds and intact distal pulses.   Pulmonary/Chest: Effort normal and breath sounds normal.  Abdominal: Soft. Bowel sounds are normal.  Musculoskeletal: Normal range of motion. She exhibits tenderness.       Legs: Neurological: She is alert and oriented to person, place, and time.  Skin: Skin is warm and dry.  Psychiatric: She has a normal mood and affect. Her  behavior is normal. Judgment and thought content normal.    ED Course  Procedures (including critical care time)  Labs Reviewed - No data to display Dg Hip Complete Right  12/23/2011  *RADIOLOGY REPORT*  Clinical Data: Fall, right hip pain  RIGHT HIP - COMPLETE 2+ VIEW  Comparison: None.  Findings: Intact right hip.  No fracture evident.  Normal alignment.  Pelvis unremarkable.  No diastasis.  Normal SI joints.  IMPRESSION: No acute osseous finding.  Original Report Authenticated By: Judie Petit. Ruel Favors, M.D.     No diagnosis found.  4:17 PM Patient's pain is improved after IM dilaudid.  She has a referral with Dr. Otelia Sergeant from yesterday's visit.  Leg pain d/t fall.  MDM          Jimmye Norman, NP 12/25/11 0140

## 2011-12-24 NOTE — ED Provider Notes (Signed)
History     CSN: [id number]  Arrival date & time 12/23/11  4098   First MD Initiated Contact with Patient 12/23/11 2042      Chief Complaint  Patient presents with  . Fall  . Hip Pain    (Consider location/radiation/quality/duration/timing/severity/associated sxs/prior treatment) HPI  Past Medical History  Diagnosis Date  . Depression     Past Surgical History  Procedure Date  . Cholecystectomy   . Appendectomy     History reviewed. No pertinent family history.  History  Substance Use Topics  . Smoking status: Never Smoker   . Smokeless tobacco: Not on file  . Alcohol Use: No    OB History    Grav Para Term Preterm Abortions TAB SAB Ect Mult Living                  Review of Systems  Allergies  Penicillins; Rocephin; and Ativan  Home Medications   Current Outpatient Rx  Name Route Sig Dispense Refill  . ALPRAZOLAM 0.25 MG PO TABS Oral Take 0.25 mg by mouth at bedtime as needed. For anxiety/sleep.    Marland Kitchen GABAPENTIN 400 MG PO CAPS Oral Take 400 mg by mouth 2 (two) times daily.    . IBUPROFEN 800 MG PO TABS Oral Take 800 mg by mouth 3 (three) times daily. Pain.    . ILOPERIDONE 2 MG PO TABS Oral Take 2 mg by mouth 2 (two) times daily.    Marland Kitchen OMEPRAZOLE 20 MG PO CPDR Oral Take 20 mg by mouth daily.    . TRAZODONE HCL 150 MG PO TABS Oral Take 150 mg by mouth at bedtime.    Marland Kitchen HYDROCODONE-ACETAMINOPHEN 5-325 MG PO TABS Oral Take 1 tablet by mouth every 4 (four) hours as needed for pain. 10 tablet 0  . METHOCARBAMOL 500 MG PO TABS Oral Take 1 tablet (500 mg total) by mouth 2 (two) times daily. 20 tablet 0  . OXYCODONE-ACETAMINOPHEN 5-325 MG PO TABS Oral Take 1 tablet by mouth every 6 (six) hours as needed for pain. 10 tablet 0    BP 126/78  Pulse 105  Temp 99.6 F (37.6 C)  Resp 16  SpO2 95%  LMP 12/16/2011  Physical Exam  ED Course  Procedures (including critical care time)  Labs Reviewed  URINALYSIS, ROUTINE W REFLEX MICROSCOPIC - Abnormal; Notable  for the following:    Ketones, ur TRACE (*)    All other components within normal limits  POCT PREGNANCY, URINE  LAB REPORT - SCANNED   Dg Hip Complete Right  12/23/2011  *RADIOLOGY REPORT*  Clinical Data: Fall, right hip pain  RIGHT HIP - COMPLETE 2+ VIEW  Comparison: None.  Findings: Intact right hip.  No fracture evident.  Normal alignment.  Pelvis unremarkable.  No diastasis.  Normal SI joints.  IMPRESSION: No acute osseous finding.  Original Report Authenticated By: Judie Petit. Ruel Favors, M.D.     1. Muscle strain       MDM  Medical screening examination/treatment/procedure(s) were performed by non-physician practitioner and as supervising physician I was immediately available for consultation/collaboration.        Donnetta Hutching, MD 12/24/11 [phone]

## 2011-12-24 NOTE — ED Notes (Signed)
Pt seen here yesterday after fall down 12 steps. Given Rx for Robaxin and Vicodin. States was "up all night" with pain to left thigh and hip.

## 2011-12-24 NOTE — Discharge Instructions (Signed)
Hematoma   A hematoma is a pocket of blood that collects under the skin, in an organ, in a body space, in a joint space, or in other tissue. The blood can clot to form a lump that you can see and feel. The lump is often firm, sore, and sometimes even painful and tender. Most hematomas get better in a few days to weeks. However, some hematomas may be serious and require medical care. Hematomas can range in size from very small to very large.   CAUSES   A hematoma can be caused by a blunt or penetrating injury. It can also be caused by leakage from a blood vessel under the skin. Spontaneous leakage from a blood vessel is more likely to occur in elderly people, especially those taking blood thinners. Sometimes, a hematoma can develop after certain medical procedures.   SYMPTOMS   Unlike a bruise, a hematoma forms a firm lump that you can feel. This lump is the collection of blood. The collection of blood can also cause your skin to turn a blue to dark blue color. If the hematoma is close to the surface of the skin, it often produces a yellowish color in the skin.   DIAGNOSIS   Your caregiver can determine whether you have a hematoma based on your history and a physical exam.   TREATMENT   Hematomas usually go away on their own over time. Rarely does the blood need to be drained out of the body.   HOME CARE INSTRUCTIONS   Put ice on the injured area.   Put ice in a plastic bag.   Place a towel between your skin and the bag.   Leave the ice on for 15 to 20 minutes, 3 to 4 times a day for the first 1 to 2 days.   After the first 2 days, switch to using warm compresses on the hematoma.   Elevate the injured area to help decrease pain and swelling. Wrapping the area with an elastic bandage may also be helpful. Compression helps to reduce swelling and promotes shrinking of the hematoma. Make sure the bandage is not wrapped too tight.   If your hematoma is on a lower extremity and is painful, crutches may be helpful for a  couple days.   Only take over-the-counter or prescription medicines for pain, discomfort, or fever as directed by your caregiver. Most patients can take acetaminophen or ibuprofen for the pain.   SEEK IMMEDIATE MEDICAL CARE IF:   You have increasing pain, or your pain is not controlled with medicine.   You have a fever.   You have worsening swelling or discoloration.   Your skin over the hematoma breaks or starts bleeding.   MAKE SURE YOU:   Understand these instructions.   Will watch your condition.   Will get help right away if you are not doing well or get worse.   Document Released: 04/16/2004 Document Revised: 08/22/2011 Document Reviewed: 05/06/2011   [employer]® Patient Information ©2012 [employer].

## 2011-12-25 NOTE — ED Provider Notes (Signed)
Medical screening examination/treatment/procedure(s) were performed by non-physician practitioner and as supervising physician I was immediately available for consultation/collaboration.  Ranata Laughery, MD 12/25/11 1647 

## 2011-12-30 NOTE — ED Provider Notes (Signed)
Medical screening examination/treatment/procedure(s) were performed by non-physician practitioner and as supervising physician I was immediately available for consultation/collaboration.   Anuel Sitter, MD 12/30/11 1510 

## 2012-02-16 ENCOUNTER — Inpatient Hospital Stay (HOSPITAL_COMMUNITY)
Admission: AD | Admit: 2012-02-16 | Discharge: 2012-02-17 | Disposition: A | Discharge status: Home / Self Care | Payer: Self-pay | Source: Ambulatory Visit | Attending: Obstetrics and Gynecology | Admitting: Obstetrics and Gynecology

## 2012-02-16 ENCOUNTER — Inpatient Hospital Stay (HOSPITAL_COMMUNITY): Payer: Self-pay

## 2012-02-16 ENCOUNTER — Encounter (HOSPITAL_COMMUNITY): Payer: Self-pay | Admitting: *Deleted

## 2012-02-16 DIAGNOSIS — N83209 Unspecified ovarian cyst, unspecified side: Secondary | ICD-10-CM | POA: Insufficient documentation

## 2012-02-16 DIAGNOSIS — R109 Unspecified abdominal pain: Secondary | ICD-10-CM | POA: Insufficient documentation

## 2012-02-16 DIAGNOSIS — N949 Unspecified condition associated with female genital organs and menstrual cycle: Secondary | ICD-10-CM | POA: Insufficient documentation

## 2012-02-16 LAB — URINALYSIS, ROUTINE W REFLEX MICROSCOPIC
Bilirubin Urine: NEGATIVE
Glucose, UA: NEGATIVE mg/dL
Ketones, ur: NEGATIVE mg/dL
pH: 6 (ref 5.0–8.0)

## 2012-02-16 LAB — URINE MICROSCOPIC-ADD ON

## 2012-02-16 MED ORDER — OXYCODONE-ACETAMINOPHEN 5-325 MG PO TABS
1.0000 | ORAL_TABLET | Freq: Once | ORAL | Status: AC
  Administered 2012-02-16: 1 via ORAL
  Filled 2012-02-16: qty 1

## 2012-02-16 NOTE — MAU Note (Signed)
Pain started tonight at 2130, pt states it is like a knife going through her abdomen and back. Pt state has sx of ovarian cyst with torsion/

## 2012-02-16 NOTE — MAU Note (Signed)
Pt reports surgery in 2007 to remove left ovarian cyst and "scar tissue", pt reports pain started about 2.5 hours ago, states it feels like it did when she had the ovarian cyst. Denies vomiting, dysuria, vaginal bleeding or fever. Some nausea when pain hits.

## 2012-02-17 MED ORDER — LACTATED RINGERS IV SOLN: INTRAVENOUS | Status: DC

## 2012-02-17 MED ORDER — HYDROMORPHONE HCL PF 1 MG/ML IJ SOLN
1.0000 mg | Freq: Once | INTRAMUSCULAR | Status: DC
  Filled 2012-02-17: qty 1

## 2012-02-17 MED ORDER — OXYCODONE-ACETAMINOPHEN 5-325 MG PO TABS: 2.0000 | ORAL_TABLET | ORAL | Status: DC | PRN

## 2012-02-17 MED ORDER — ONDANSETRON HCL 4 MG/2ML IJ SOLN: 4.0000 mg | Freq: Once | INTRAMUSCULAR | Status: DC

## 2012-02-17 MED ORDER — ONDANSETRON 8 MG PO TBDP: 8.0000 mg | ORAL_TABLET | Freq: Once | ORAL | Status: DC

## 2012-02-17 NOTE — MAU Provider Note (Signed)
History     CSN: [id number]  Arrival date & time 02/16/12  2244   None     Chief Complaint  Patient presents with  . Abdominal Pain    HPI Patricia [name] is a 36 y.o. female who presents to MAU for abdominal pain that started suddenly tonight approximately 9:30 pm. The patient is not pregnant. She describes the pain as sharp like a knife that is in the lower abdomen and radiates to the lower back. The pain is located on the left side of the abdomen. The patient has a history of ovarian cyst and torsion in 2007. Nausea and vomiting when pain gets severe.  Not sexually active in over a year. Last pap smear 2011 and was normal. No history of STI's. The history was provided by the patient.  Past Medical History  Diagnosis Date  . Depression     Past Surgical History  Procedure Date  . Cholecystectomy   . Appendectomy   . Laporoscopy     Family History  Problem Relation Age of Onset  . Diabetes Mother   . Hypertension Mother   . Hypertension Father     History  Substance Use Topics  . Smoking status: Never Smoker   . Smokeless tobacco: Not on file  . Alcohol Use: No    OB History    Grav Para Term Preterm Abortions TAB SAB Ect Mult Living   5 3 2 1 2 1 1  0 0       Review of Systems  Constitutional: Negative for fever, chills, diaphoresis and fatigue.  HENT: Negative for ear pain, congestion, sore throat, facial swelling, neck pain, neck stiffness, dental problem and sinus pressure.   Eyes: Negative for photophobia, pain, discharge and visual disturbance.  Respiratory: Negative for cough, chest tightness and wheezing.   Cardiovascular: Negative for chest pain and palpitations.  Gastrointestinal: Positive for nausea, vomiting and abdominal pain. Negative for diarrhea, constipation and abdominal distention.  Genitourinary: Positive for pelvic pain. Negative for dysuria, frequency, flank pain, vaginal bleeding, vaginal discharge and difficulty urinating.  Musculoskeletal:  Positive for back pain. Negative for myalgias and gait problem.  Skin: Negative for color change and rash.  Neurological: Negative for dizziness, speech difficulty, weakness, light-headedness, numbness and headaches.  Psychiatric/Behavioral: Negative for confusion and agitation. The patient is nervous/anxious.     Allergies  Penicillins; Rocephin; and Ativan  Home Medications  No current outpatient prescriptions on file.  BP 117/73  Pulse 120  Temp(Src) 98.7 F (37.1 C) (Oral)  Resp 20  Ht 5\' 8"  (1.727 m)  Wt 244 lb (110.678 kg)  BMI 37.10 kg/m2  SpO2 99%  LMP 01/26/2012  Physical Exam  Nursing note and vitals reviewed. Constitutional: She is oriented to person, place, and time. She appears well-developed and well-nourished.  Eyes: EOM are normal.  Neck: Neck supple.  Pulmonary/Chest: Effort normal.  Abdominal: Soft. There is no tenderness.  Musculoskeletal: Normal range of motion.  Neurological: She is alert and oriented to person, place, and time. No cranial nerve deficit.  Skin: Skin is warm and dry.   Results for orders placed during the hospital encounter of 02/16/12 (from the past 24 hour(s))  URINALYSIS, ROUTINE W REFLEX MICROSCOPIC     Status: Abnormal   Collection Time   02/16/12 11:05 PM      Component Value Range   Color, Urine YELLOW  YELLOW    APPearance HAZY (*) CLEAR    Specific Gravity, Urine >1.030 (*) 1.005 - 1.030  pH 6.0  5.0 - 8.0    Glucose, UA NEGATIVE  NEGATIVE (mg/dL)   Hgb urine dipstick NEGATIVE  NEGATIVE    Bilirubin Urine NEGATIVE  NEGATIVE    Ketones, ur NEGATIVE  NEGATIVE (mg/dL)   Protein, ur 30 (*) NEGATIVE (mg/dL)   Urobilinogen, UA 0.2  0.0 - 1.0 (mg/dL)   Nitrite NEGATIVE  NEGATIVE    Leukocytes, UA TRACE (*) NEGATIVE   URINE MICROSCOPIC-ADD ON     Status: Abnormal   Collection Time   02/16/12 11:05 PM      Component Value Range   Squamous Epithelial / LPF FEW (*) RARE    WBC, UA 3-6  <3 (WBC/hpf)   RBC / HPF 0-2  <3 (RBC/hpf)    Bacteria, UA FEW (*) RARE   POCT PREGNANCY, URINE     Status: Normal   Collection Time   02/16/12 11:19 PM      Component Value Range   Preg Test, Ur NEGATIVE  NEGATIVE    ED Course  Procedures   US Transvaginal Non-ob  02/17/2012  *RADIOLOGY REPORT*  Clinical Data: Pelvic pain.  TRANSABDOMINAL AND TRANSVAGINAL ULTRASOUND OF PELVIS Technique:  Both transabdominal and transvaginal ultrasound examinations of the pelvis were performed. Transabdominal technique was performed for global imaging of the pelvis including uterus, ovaries, adnexal regions, and pelvic cul-de-sac.  Comparison: 03/06/2011.   It was necessary to proceed with endovaginal exam following the transabdominal exam to visualize the ovaries and endometrium.  Findings:  Uterus: Measures 9.0 x 4.9 x 6.0 cm.  No myometrial abnormalities are identified.  Endometrium: Normal in thickness measuring a maximum of 7.5 mm.  Right ovary:  Measures 4.9 x 4.3 x 4.5 cm.  There is a complex, likely hemorrhagic, 4.3 x 3.2 x 3.8 cm cyst.  Left ovary: Measures 4.0 x 2.6 x 2.7 cm.  No cysts or masses.  Other findings: No free fluid  IMPRESSION:  1.  Normal sonographic appearance of the uterus except for Nabothian cysts. 2.  4.3 x 3.2 x 3.8 cm likely hemorrhagic cyst associated with the right ovary.  Recommend a follow-up pelvic ultrasound examination in 4-6 months.  Original Report Authenticated By: P. Loralie Champagne, M.D.   US Pelvis Complete  02/17/2012  *RADIOLOGY REPORT*  Clinical Data: Pelvic pain.  TRANSABDOMINAL AND TRANSVAGINAL ULTRASOUND OF PELVIS Technique:  Both transabdominal and transvaginal ultrasound examinations of the pelvis were performed. Transabdominal technique was performed for global imaging of the pelvis including uterus, ovaries, adnexal regions, and pelvic cul-de-sac.  Comparison: 03/06/2011.   It was necessary to proceed with endovaginal exam following the transabdominal exam to visualize the ovaries and endometrium.  Findings:   Uterus: Measures 9.0 x 4.9 x 6.0 cm.  No myometrial abnormalities are identified.  Endometrium: Normal in thickness measuring a maximum of 7.5 mm.  Right ovary:  Measures 4.9 x 4.3 x 4.5 cm.  There is a complex, likely hemorrhagic, 4.3 x 3.2 x 3.8 cm cyst.  Left ovary: Measures 4.0 x 2.6 x 2.7 cm.  No cysts or masses.  Other findings: No free fluid  IMPRESSION:  1.  Normal sonographic appearance of the uterus except for Nabothian cysts. 2.  4.3 x 3.2 x 3.8 cm likely hemorrhagic cyst associated with the right ovary.  Recommend a follow-up pelvic ultrasound examination in 4-6 months.  Original Report Authenticated By: P. Loralie Champagne, M.D.   Assessment: Right ovarian cyst  Plan:  IV Dilaudid   Zofran 4 mg IV   Rx Percocet  Continue ibuprofen    MDM  02:10 am Patient states she is feeling very well now and wants to go home.   Will d/c home to follow up in [facility], she will return here as needed. I discussed in detail with patient the ultrasound results and need for follow up. Patient voices understanding. I have reviewed this patient's vital signs, nurses notes, appropriate labs and imaging.

## 2012-02-17 NOTE — Discharge Instructions (Signed)
Someone will call you from the GYN office to schedule a follow up appointment. If you need to contact them the number is [phone].  Ovarian Cyst The ovaries are small organs that are on each side of the uterus. The ovaries are the organs that produce the female hormones, estrogen and progesterone. An ovarian cyst is a sac filled with fluid that can vary in its size. It is normal for a small cyst to form in women who are in the childbearing age and who have menstrual periods. This type of cyst is called a follicle cyst that becomes an ovulation cyst (corpus luteum cyst) after it produces the women's egg. It later goes away on its own if the woman does not become pregnant. There are other kinds of ovarian cysts that may cause problems and may need to be treated. The most serious problem is a cyst with cancer. It should be noted that menopausal women who have an ovarian cyst are at a higher risk of it being a cancer cyst. They should be evaluated very quickly, thoroughly and followed closely. This is especially true in menopausal women because of the high rate of ovarian cancer in women in menopause. CAUSES AND TYPES OF OVARIAN CYSTS:  FUNCTIONAL CYST: The follicle/corpus luteum cyst is a functional cyst that occurs every month during ovulation with the menstrual cycle. They go away with the next menstrual cycle if the woman does not get pregnant. Usually, there are no symptoms with a functional cyst.   ENDOMETRIOMA CYST: This cyst develops from the lining of the uterus tissue. This cyst gets in or on the ovary. It grows every month from the bleeding during the menstrual period. It is also called a "chocolate cyst" because it becomes filled with blood that turns brown. This cyst can cause pain in the lower abdomen during intercourse and with your menstrual period.   CYSTADENOMA CYST: This cyst develops from the cells on the outside of the ovary. They usually are not cancerous. They can get very big and cause  lower abdomen pain and pain with intercourse. This type of cyst can twist on itself, cut off its blood supply and cause severe pain. It also can easily rupture and cause a lot of pain.   DERMOID CYST: This type of cyst is sometimes found in both ovaries. They are found to have different kinds of body tissue in the cyst. The tissue includes skin, teeth, hair, and/or cartilage. They usually do not have symptoms unless they get very big. Dermoid cysts are rarely cancerous.   POLYCYSTIC OVARY: This is a rare condition with hormone problems that produces many small cysts on both ovaries. The cysts are follicle-like cysts that never produce an egg and become a corpus luteum. It can cause an increase in body weight, infertility, acne, increase in body and facial hair and lack of menstrual periods or rare menstrual periods. Many women with this problem develop type 2 diabetes. The exact cause of this problem is unknown. A polycystic ovary is rarely cancerous.   THECA LUTEIN CYST: Occurs when too much hormone (human chorionic gonadotropin) is produced and over-stimulates the ovaries to produce an egg. They are frequently seen when doctors stimulate the ovaries for invitro-fertilization (test tube babies).   LUTEOMA CYST: This cyst is seen during pregnancy. Rarely it can cause an obstruction to the birth canal during labor and delivery. They usually go away after delivery.  SYMPTOMS   Pelvic pain or pressure.   Pain during sexual intercourse.  Increasing girth (swelling) of the abdomen.   Abnormal menstrual periods.   Increasing pain with menstrual periods.   You stop having menstrual periods and you are not pregnant.  DIAGNOSIS  The diagnosis can be made during:  Routine or annual pelvic examination (common).   Ultrasound.   X-ray of the pelvis.   CT Scan.   MRI.   Blood tests.  TREATMENT   Treatment may only be to follow the cyst monthly for 2 to 3 months with your caregiver. Many go  away on their own, especially functional cysts.   May be aspirated (drained) with a long needle with ultrasound, or by laparoscopy (inserting a tube into the pelvis through a small incision).   The whole cyst can be removed by laparoscopy.   Sometimes the cyst may need to be removed through an incision in the lower abdomen.   Hormone treatment is sometimes used to help dissolve certain cysts.   Birth control pills are sometimes used to help dissolve certain cysts.  HOME CARE INSTRUCTIONS  Follow your caregiver's advice regarding:  Medicine.   Follow up visits to evaluate and treat the cyst.   You may need to come back or make an appointment with another caregiver, to find the exact cause of your cyst, if your caregiver is not a gynecologist.   Get your yearly and recommended pelvic examinations and Pap tests.   Let your caregiver know if you have had an ovarian cyst in the past.  SEEK MEDICAL CARE IF:   Your periods are late, irregular, they stop, or are painful.   Your stomach (abdomen) or pelvic pain does not go away.   Your stomach becomes larger or swollen.   You have pressure on your bladder or trouble emptying your bladder completely.   You have painful sexual intercourse.   You have feelings of fullness, pressure, or discomfort in your stomach.   You lose weight for no apparent reason.   You feel generally ill.   You become constipated.   You lose your appetite.   You develop acne.   You have an increase in body and facial hair.   You are gaining weight, without changing your exercise and eating habits.   You think you are pregnant.  SEEK IMMEDIATE MEDICAL CARE IF:   You have increasing abdominal pain.   You feel sick to your stomach (nausea) and/or vomit.   You develop a fever that comes on suddenly.   You develop abdominal pain during a bowel movement.   Your menstrual periods become heavier than usual.  Document Released: 09/02/2005 Document  Revised: 08/22/2011 Document Reviewed: 07/06/2009 [facility] Patient Information 2012 [place], [place].

## 2012-02-17 NOTE — Progress Notes (Signed)
Pt calling on the phone for ride. Pt states her mother was going to pick her up

## 2012-02-17 NOTE — MAU Provider Note (Signed)
Agree with above note.  Patricia [name] 02/17/2012 6:00 AM   

## 2012-02-19 ENCOUNTER — Encounter: Payer: Self-pay | Admitting: Obstetrics and Gynecology

## 2012-02-27 ENCOUNTER — Inpatient Hospital Stay (HOSPITAL_COMMUNITY)
Admission: AD | Admit: 2012-02-27 | Discharge: 2012-03-04 | DRG: 885 | Disposition: A | Discharge status: Home / Self Care | Payer: Federal, State, Local not specified - Other | Source: Ambulatory Visit | Attending: Psychiatry | Admitting: Psychiatry

## 2012-02-27 ENCOUNTER — Encounter (HOSPITAL_COMMUNITY): Payer: Self-pay | Admitting: Adult Health

## 2012-02-27 ENCOUNTER — Emergency Department (HOSPITAL_COMMUNITY): Payer: Self-pay

## 2012-02-27 ENCOUNTER — Emergency Department (HOSPITAL_COMMUNITY)
Admission: EM | Admit: 2012-02-27 | Discharge: 2012-02-27 | Disposition: A | Discharge status: Home / Self Care | Payer: Self-pay | Attending: Emergency Medicine | Admitting: Emergency Medicine

## 2012-02-27 DIAGNOSIS — F259 Schizoaffective disorder, unspecified: Principal | ICD-10-CM | POA: Diagnosis present

## 2012-02-27 DIAGNOSIS — R079 Chest pain, unspecified: Secondary | ICD-10-CM | POA: Insufficient documentation

## 2012-02-27 DIAGNOSIS — Z79899 Other long term (current) drug therapy: Secondary | ICD-10-CM | POA: Insufficient documentation

## 2012-02-27 DIAGNOSIS — R45851 Suicidal ideations: Secondary | ICD-10-CM | POA: Insufficient documentation

## 2012-02-27 DIAGNOSIS — F1994 Other psychoactive substance use, unspecified with psychoactive substance-induced mood disorder: Secondary | ICD-10-CM | POA: Diagnosis present

## 2012-02-27 DIAGNOSIS — Z8659 Personal history of other mental and behavioral disorders: Secondary | ICD-10-CM | POA: Insufficient documentation

## 2012-02-27 DIAGNOSIS — F411 Generalized anxiety disorder: Secondary | ICD-10-CM | POA: Diagnosis present

## 2012-02-27 DIAGNOSIS — F1410 Cocaine abuse, uncomplicated: Secondary | ICD-10-CM | POA: Diagnosis present

## 2012-02-27 DIAGNOSIS — F1920 Other psychoactive substance dependence, uncomplicated: Secondary | ICD-10-CM | POA: Diagnosis present

## 2012-02-27 DIAGNOSIS — F1910 Other psychoactive substance abuse, uncomplicated: Secondary | ICD-10-CM

## 2012-02-27 DIAGNOSIS — F1190 Opioid use, unspecified, uncomplicated: Secondary | ICD-10-CM

## 2012-02-27 DIAGNOSIS — F250 Schizoaffective disorder, bipolar type: Secondary | ICD-10-CM

## 2012-02-27 HISTORY — DX: Schizoaffective disorder, bipolar type: F25.0

## 2012-02-27 LAB — URINALYSIS, ROUTINE W REFLEX MICROSCOPIC
Glucose, UA: NEGATIVE mg/dL
Ketones, ur: >80 mg/dL — AB
Nitrite: NEGATIVE
Protein, ur: NEGATIVE mg/dL

## 2012-02-27 LAB — COMPREHENSIVE METABOLIC PANEL
Alkaline Phosphatase: 68 U/L (ref 39–117)
BUN: 10 mg/dL (ref 6–23)
CO2: 22 mEq/L (ref 19–32)
Chloride: 104 mEq/L (ref 96–112)
GFR calc Af Amer: >90 mL/min (ref >90)
Glucose, Bld: 106 mg/dL — ABNORMAL HIGH (ref 70–99)
Potassium: 3 mEq/L — ABNORMAL LOW (ref 3.5–5.1)
Total Bilirubin: 0.4 mg/dL (ref 0.3–1.2)

## 2012-02-27 LAB — RAPID URINE DRUG SCREEN, HOSP PERFORMED: Amphetamines: NOT DETECTED

## 2012-02-27 LAB — URINE MICROSCOPIC-ADD ON

## 2012-02-27 LAB — CBC
HCT: 39.9 % (ref 36.0–46.0)
Hemoglobin: 13.6 g/dL (ref 12.0–15.0)
WBC: 15.2 10*3/uL — ABNORMAL HIGH (ref 4.0–10.5)

## 2012-02-27 LAB — ETHANOL: Alcohol, Ethyl (B): <11 mg/dL (ref 0–11)

## 2012-02-27 LAB — ACETAMINOPHEN LEVEL: Acetaminophen (Tylenol), Serum: <15 ug/mL (ref 10–30)

## 2012-02-27 LAB — POCT I-STAT TROPONIN I: Troponin i, poc: 0.02 ng/mL (ref 0.00–0.08)

## 2012-02-27 MED ORDER — HALOPERIDOL LACTATE 5 MG/ML IJ SOLN
5.0000 mg | Freq: Once | INTRAMUSCULAR | Status: AC
  Administered 2012-02-27: 5 mg via INTRAVENOUS
  Filled 2012-02-27: qty 1

## 2012-02-27 MED ORDER — DICYCLOMINE HCL 20 MG PO TABS: 20.0000 mg | ORAL_TABLET | Freq: Four times a day (QID) | ORAL | Status: AC | PRN

## 2012-02-27 MED ORDER — LOPERAMIDE HCL 2 MG PO CAPS: 2.0000 mg | ORAL_CAPSULE | ORAL | Status: AC | PRN

## 2012-02-27 MED ORDER — TRAZODONE HCL 150 MG PO TABS
150.0000 mg | ORAL_TABLET | Freq: Every day | ORAL | Status: DC
  Filled 2012-02-27: qty 1

## 2012-02-27 MED ORDER — ONDANSETRON HCL 4 MG/2ML IJ SOLN
4.0000 mg | Freq: Once | INTRAMUSCULAR | Status: DC
  Filled 2012-02-27: qty 2

## 2012-02-27 MED ORDER — MAGNESIUM HYDROXIDE 400 MG/5ML PO SUSP: 30.0000 mL | Freq: Every day | ORAL | Status: DC | PRN

## 2012-02-27 MED ORDER — ILOPERIDONE 4 MG PO TABS
2.0000 mg | ORAL_TABLET | Freq: Two times a day (BID) | ORAL | Status: DC
  Filled 2012-02-27 (×2): qty 1

## 2012-02-27 MED ORDER — IBUPROFEN 200 MG PO TABS: 600.0000 mg | ORAL_TABLET | Freq: Three times a day (TID) | ORAL | Status: DC | PRN

## 2012-02-27 MED ORDER — TRAZODONE HCL 50 MG PO TABS
50.0000 mg | ORAL_TABLET | Freq: Every evening | ORAL | Status: DC | PRN
  Administered 2012-02-27: 50 mg via ORAL
  Filled 2012-02-27 (×6): qty 1

## 2012-02-27 MED ORDER — METHOCARBAMOL 500 MG PO TABS
500.0000 mg | ORAL_TABLET | Freq: Three times a day (TID) | ORAL | Status: AC | PRN
  Administered 2012-02-28 – 2012-03-03 (×9): 500 mg via ORAL
  Filled 2012-02-27 (×9): qty 1

## 2012-02-27 MED ORDER — PROMETHAZINE HCL 25 MG/ML IJ SOLN
12.5000 mg | Freq: Once | INTRAMUSCULAR | Status: AC
Start: 2012-02-27 — End: 2012-02-27
  Administered 2012-02-27: 12.5 mg via INTRAVENOUS
  Filled 2012-02-27 (×2): qty 1

## 2012-02-27 MED ORDER — ACETAMINOPHEN 325 MG PO TABS
650.0000 mg | ORAL_TABLET | Freq: Four times a day (QID) | ORAL | Status: DC | PRN
Start: 2012-02-27 — End: 2012-03-04
  Administered 2012-02-28 – 2012-03-04 (×10): 650 mg via ORAL

## 2012-02-27 MED ORDER — HYDROXYZINE HCL 25 MG PO TABS
25.0000 mg | ORAL_TABLET | Freq: Four times a day (QID) | ORAL | Status: AC | PRN
  Administered 2012-02-28 – 2012-03-03 (×7): 25 mg via ORAL
  Filled 2012-02-27 (×2): qty 1

## 2012-02-27 MED ORDER — ACETAMINOPHEN 325 MG PO TABS
650.0000 mg | ORAL_TABLET | ORAL | Status: DC | PRN
Start: 2012-02-27 — End: 2012-02-27

## 2012-02-27 MED ORDER — ILOPERIDONE 4 MG PO TABS
2.0000 mg | ORAL_TABLET | Freq: Two times a day (BID) | ORAL | Status: DC
  Administered 2012-02-28 – 2012-03-04 (×11): 2 mg via ORAL
  Filled 2012-02-27: qty 1
  Filled 2012-02-27: qty 28
  Filled 2012-02-27 (×2): qty 1
  Filled 2012-02-27: qty 28
  Filled 2012-02-27 (×11): qty 1

## 2012-02-27 MED ORDER — ZOLPIDEM TARTRATE 5 MG PO TABS: 5.0000 mg | ORAL_TABLET | Freq: Every evening | ORAL | Status: DC | PRN

## 2012-02-27 MED ORDER — GABAPENTIN 400 MG PO CAPS
400.0000 mg | ORAL_CAPSULE | Freq: Two times a day (BID) | ORAL | Status: DC
  Administered 2012-02-27 (×3): 400 mg via ORAL
  Filled 2012-02-27 (×3): qty 1

## 2012-02-27 MED ORDER — PANTOPRAZOLE SODIUM 40 MG PO TBEC
40.0000 mg | DELAYED_RELEASE_TABLET | Freq: Every day | ORAL | Status: DC
  Administered 2012-02-27 – 2012-02-28 (×2): 40 mg via ORAL
  Filled 2012-02-27 (×4): qty 1

## 2012-02-27 MED ORDER — HALOPERIDOL LACTATE 5 MG/ML IJ SOLN
5.0000 mg | Freq: Once | INTRAMUSCULAR | Status: AC
  Administered 2012-02-27: 5 mg via INTRAMUSCULAR
  Filled 2012-02-27: qty 1

## 2012-02-27 MED ORDER — DIAZEPAM 5 MG/ML IJ SOLN
2.5000 mg | Freq: Once | INTRAMUSCULAR | Status: AC
  Administered 2012-02-27: 2.5 mg via INTRAVENOUS
  Filled 2012-02-27: qty 2

## 2012-02-27 MED ORDER — ONDANSETRON 4 MG PO TBDP: 4.0000 mg | ORAL_TABLET | Freq: Four times a day (QID) | ORAL | Status: DC | PRN

## 2012-02-27 MED ORDER — POTASSIUM CHLORIDE CRYS ER 20 MEQ PO TBCR: 40.0000 meq | EXTENDED_RELEASE_TABLET | Freq: Once | ORAL | Status: DC

## 2012-02-27 MED ORDER — ALUM & MAG HYDROXIDE-SIMETH 200-200-20 MG/5ML PO SUSP: 30.0000 mL | ORAL | Status: DC | PRN

## 2012-02-27 MED ORDER — ONDANSETRON HCL 4 MG/2ML IJ SOLN: 4.0000 mg | Freq: Once | INTRAMUSCULAR | Status: DC

## 2012-02-27 MED ORDER — PROMETHAZINE HCL 25 MG/ML IJ SOLN
12.5000 mg | Freq: Once | INTRAMUSCULAR | Status: AC
  Administered 2012-02-27: 12.5 mg via INTRAVENOUS
  Filled 2012-02-27: qty 1

## 2012-02-27 MED ORDER — CLONIDINE HCL 0.2 MG/24HR TD PTWK
0.2000 mg | MEDICATED_PATCH | TRANSDERMAL | Status: DC
  Administered 2012-02-27: 0.2 mg via TRANSDERMAL
  Filled 2012-02-27 (×2): qty 1

## 2012-02-27 MED ORDER — ALPRAZOLAM 0.25 MG PO TABS
0.2500 mg | ORAL_TABLET | Freq: Every evening | ORAL | Status: DC | PRN
  Administered 2012-02-27: 0.25 mg via ORAL
  Filled 2012-02-27: qty 1

## 2012-02-27 MED ORDER — NAPROXEN 500 MG PO TABS
500.0000 mg | ORAL_TABLET | Freq: Two times a day (BID) | ORAL | Status: AC | PRN
  Administered 2012-02-28 – 2012-03-03 (×4): 500 mg via ORAL
  Filled 2012-02-27 (×4): qty 1

## 2012-02-27 MED ORDER — PANTOPRAZOLE SODIUM 40 MG PO TBEC
40.0000 mg | DELAYED_RELEASE_TABLET | Freq: Every day | ORAL | Status: DC
  Administered 2012-02-27: 40 mg via ORAL
  Filled 2012-02-27: qty 1

## 2012-02-27 MED ORDER — DIAZEPAM 5 MG PO TABS
5.0000 mg | ORAL_TABLET | Freq: Once | ORAL | Status: AC
  Administered 2012-02-27: 5 mg via ORAL
  Filled 2012-02-27: qty 1

## 2012-02-27 MED ORDER — GABAPENTIN 400 MG PO CAPS
400.0000 mg | ORAL_CAPSULE | Freq: Four times a day (QID) | ORAL | Status: DC
  Administered 2012-02-27 – 2012-02-28 (×3): 400 mg via ORAL
  Filled 2012-02-27 (×12): qty 1

## 2012-02-27 MED ORDER — PROMETHAZINE HCL 25 MG PO TABS
25.0000 mg | ORAL_TABLET | Freq: Four times a day (QID) | ORAL | Status: DC | PRN
Start: 2012-02-27 — End: 2012-02-27
  Administered 2012-02-27: 25 mg via ORAL
  Filled 2012-02-27 (×2): qty 1

## 2012-02-27 MED ORDER — ILOPERIDONE 2 MG PO TABS: 2.0000 mg | ORAL_TABLET | Freq: Two times a day (BID) | ORAL | Status: DC

## 2012-02-27 NOTE — ED Provider Notes (Signed)
[facility] by Dr. Dan Humphreys. No further episodes of chest pain.  BP 106/69  Pulse 103  Temp 99.4 F (37.4 C) (Oral)  Resp 16  SpO2 96%  LMP 01/26/2012   Patricia Octave, MD 02/27/12 2053

## 2012-02-27 NOTE — ED Notes (Signed)
IV noted to be swelling after phenergan given; IV d/c and ice applied to arm; EDP made aware

## 2012-02-27 NOTE — ED Notes (Signed)
Pt does not want meal tray pt stated that "the smell of it made her sick"

## 2012-02-27 NOTE — ED Notes (Signed)
Family talking with EDP. Pt okayed the talk.

## 2012-02-27 NOTE — ED Notes (Signed)
Patricia [name] (Mom): [phone] (Home) [phone] (Cell)  Please call mom to give updates on pts POC

## 2012-02-27 NOTE — ED Notes (Signed)
EDP notified of pt vomiting

## 2012-02-27 NOTE — ED Notes (Signed)
Dinner Ordered 

## 2012-02-27 NOTE — ED Notes (Signed)
Report given to Pat RN

## 2012-02-27 NOTE — Tx Team (Signed)
Initial Interdisciplinary Treatment Plan  PATIENT STRENGTHS: (choose at least two) Ability for insight General fund of knowledge Motivation for treatment/growth Religious Affiliation Supportive family/friends  PATIENT STRESSORS: Financial difficulties Marital or family conflict Medication change or noncompliance Occupational concerns   PROBLEM LIST: Problem List/Patient Goals Date to be addressed Date deferred Reason deferred Estimated date of resolution  depression      Suicidal thoughts                                                 DISCHARGE CRITERIA:  Motivation to continue treatment in a less acute level of care Need for constant or close observation no longer present Reduction of life-threatening or endangering symptoms to within safe limits Verbal commitment to aftercare and medication compliance  PRELIMINARY DISCHARGE PLAN: Attend aftercare/continuing care group Outpatient therapy  PATIENT/FAMIILY INVOLVEMENT: This treatment plan has been presented to and reviewed with the patient, Patricia [name], and/or family member, .  The patient and family have been given the opportunity to ask questions and make suggestions.  Patricia [name] 02/27/2012, 11:12 PM

## 2012-02-27 NOTE — ED Notes (Signed)
Pt refusing to get vitals and EKG. EDP aware. Will continue to monitor
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
## 2012-02-27 NOTE — ED Notes (Signed)
Pt stated that she is currently having  thoughts about killing herself. She stated that she does not have a plan. Will notify and continue to monitor.

## 2012-02-27 NOTE — ED Provider Notes (Signed)
History     CSN: [id number]  Arrival date & time 02/27/12  0209   First MD Initiated Contact with Patient 02/27/12 0214      Chief Complaint  Patient presents with  . Chest Pain    (Consider location/radiation/quality/duration/timing/severity/associated sxs/prior treatment) Patient is a 36 y.o. female presenting with chest pain.  Chest Pain    Patient is a 36 year old female who presents today by ambulance after having chest pain while using cocaine. Patient has history of schizophrenia and reports that she has not been taking her medications. She is tearful and very anxious upon arrival. Patient is disorganized and reports that she is at someone's house. She is oriented to self, year, and without any focal neurologic symptoms. Patient is hemodynamically stable upon arrival. Her pain is sharp and severe per her report. She received aspirin prior to arrival. Patient also reports that she did not want to live anymore. There are no other associated or modifying factors. Past Medical History  Diagnosis Date  . Depression   . Schizoaffective disorder, bipolar type     Past Surgical History  Procedure Date  . Cholecystectomy   . Appendectomy   . Laporoscopy     Family History  Problem Relation Age of Onset  . Diabetes Mother   . Hypertension Mother   . Hypertension Father     History  Substance Use Topics  . Smoking status: Never Smoker   . Smokeless tobacco: Not on file  . Alcohol Use: No    OB History    Grav Para Term Preterm Abortions TAB SAB Ect Mult Living   5 3 2 1 2 1 1  0 0       Review of Systems  Constitutional: Negative.   HENT: Negative.   Eyes: Negative.   Respiratory: Negative.   Cardiovascular: Positive for chest pain.  Gastrointestinal: Negative.   Genitourinary: Negative.   Musculoskeletal: Negative.   Skin: Negative.   Neurological: Negative.   Hematological: Negative.   Psychiatric/Behavioral: Positive for suicidal ideas, decreased  concentration and agitation. The patient is nervous/anxious.   All other systems reviewed and are negative.    Allergies  Penicillins; Rocephin; and Ativan  Home Medications   Current Outpatient Rx  Name Route Sig Dispense Refill  . ALPRAZOLAM 0.25 MG PO TABS Oral Take 0.25 mg by mouth at bedtime as needed. For anxiety/sleep.    Marland Kitchen GABAPENTIN 400 MG PO CAPS Oral Take 400 mg by mouth 2 (two) times daily.    . IBUPROFEN 800 MG PO TABS Oral Take 800 mg by mouth 3 (three) times daily. Pain.    . ILOPERIDONE 2 MG PO TABS Oral Take 2 mg by mouth 2 (two) times daily.    Marland Kitchen OMEPRAZOLE 20 MG PO CPDR Oral Take 20 mg by mouth daily.    . OXYCODONE-ACETAMINOPHEN 5-325 MG PO TABS Oral Take 2 tablets by mouth every 4 (four) hours as needed for pain. 20 tablet 0  . TRAZODONE HCL 150 MG PO TABS Oral Take 150 mg by mouth at bedtime.      BP 138/100  Pulse 90  Resp 11  SpO2 97%  LMP 01/26/2012  Physical Exam  Nursing note and vitals reviewed. GEN: Well-developed, well-nourished female very agitated HEENT: Atraumatic, normocephalic. Oropharynx clear without erythema EYES: PERRLA BL, no scleral icterus. NECK: Trachea midline, no meningismus CV: regular rate and rhythm. No murmurs, rubs, or gallops PULM: No respiratory distress.  No crackles, wheezes, or rales. GI: soft, non-tender. No guarding,  rebound, or tenderness. + bowel sounds  GU: deferred Neuro: cranial nerves grossly 2-12 intact, no abnormalities of strength or sensation, A and O x 3 MSK: Patient moves all 4 extremities symmetrically, no deformity, edema, or injury noted Skin: No rashes petechiae, purpura, or jaundice Psych: Tearful, reports "I don't want to live anymore". Tangential.  ED Course  Procedures (including critical care time)  Indication: chest pain Please note this EKG was reviewed extemporaneously by myself.  Date: 02/27/2012  Rate: 80  Rhythm: normal sinus rhythm  QRS Axis: normal  Intervals: normal  ST/T Wave  abnormalities: normal  Conduction Disutrbances: none  Narrative Interpretation: unremarkable Prior EKG available: None available                                     Labs Reviewed  CBC - Abnormal; Notable for the following:    WBC 15.2 (*)     All other components within normal limits  COMPREHENSIVE METABOLIC PANEL - Abnormal; Notable for the following:    Potassium 3.0 (*)     Glucose, Bld 106 (*)     ALT 58 (*)     All other components within normal limits  URINALYSIS, ROUTINE W REFLEX MICROSCOPIC - Abnormal; Notable for the following:    Color, Urine AMBER (*)  BIOCHEMICALS MAY BE AFFECTED BY COLOR   APPearance CLOUDY (*)     Bilirubin Urine MODERATE (*)     Ketones, ur >80 (*)     Leukocytes, UA SMALL (*)     All other components within normal limits  URINE RAPID DRUG SCREEN (HOSP PERFORMED) - Abnormal; Notable for the following:    Opiates POSITIVE (*)     Cocaine POSITIVE (*)     All other components within normal limits  SALICYLATE LEVEL - Abnormal; Notable for the following:    Salicylate Lvl 2.1 (*)     All other components within normal limits  URINE MICROSCOPIC-ADD ON - Abnormal; Notable for the following:    Squamous Epithelial / LPF FEW (*)     Bacteria, UA MANY (*)     Crystals CA OXALATE CRYSTALS (*)     All other components within normal limits  ETHANOL  ACETAMINOPHEN LEVEL  POCT PREGNANCY, URINE  POCT I-STAT TROPONIN I  POCT I-STAT TROPONIN I  URINE CULTURE   Dg Chest Portable 1 View  02/27/2012  *RADIOLOGY REPORT*  Clinical Data: Chest pain  PORTABLE CHEST - 1 VIEW  Comparison: None.  Findings: Interstitial and vascular crowding is likely accentuated by hypoaeration.  Elevated hemidiaphragms obscures the lung bases. Allowing for this, no focal consolidation, pleural effusion, or pneumothorax.  No acute osseous finding.  IMPRESSION: No definite radiographic evidence of acute cardiopulmonary process.  Original Report Authenticated By: Waneta Martins, M.D.      1. Substance abuse   2. Chest pain   3. Suicidal ideation       MDM  Patient is a [age] female with history of middle health issues who presents after using cocaine the chest pain. Patient was evaluated for chest pain and had 0 and 3 hour troponins as well as no acute ischemic changes on her EKG. Tox screen was positive for opiates as well as cocaine. Concurrent workup for likely psychiatric admission was performed. Patient did have some slight hypokalemia. This was replaced orally. Her main her workup was unremarkable. Patient did report suicidal ideation. Consult  was placed to behavioral health. Patient awaiting their evaluation at this time.          Cyndra Numbers, MD 02/27/12 [phone]

## 2012-02-27 NOTE — BH Assessment (Signed)
[facility] Assessment Progress Note      Consulted with AC Thurman Coyer and Dr. Dan Humphreys @[facility]  who agreed to admit pt for psychiatric inpatient admission. Hayden Pedro ACT team counselor notified.

## 2012-02-27 NOTE — ED Notes (Signed)
Called EMS due to chest pain, tightness and SOB. Not taking medications since Thursday for schizophrenia and bipolar. Using cocaine today. Pt is anxious, tearful, unwilling to cooperate. Attempted to jump out of ambulance while en route. Pt is stating, "I am okay, I don't want that machine on me. "Transient thought process.  Will not allow staff to take vital signs, refuses gown. Given ASA by EMS 324.

## 2012-02-27 NOTE — ED Notes (Signed)
Pt continues to refuse vitals and EKG. EDP aware.

## 2012-02-27 NOTE — Progress Notes (Signed)
Patient tearful and depressed upon admission. Patient states "I stopped taking my medication because I thought I would be fine without it." Patient states when she doesn't have her medication she "doesn't feel right." Patient admits using cocaine "a couple of times a year." Patient denies alcohol and tobacco use. Patient states she recently had a breakup with significant other, had no place to go, staying in homeless shelter x3 days. Patient states she was " at a friends house and I started having chest pain so I called an ambulance." Patient states she has three children who live with her Mom " for a couple of years." Patient denies SI/HI, denies A/V hallucinations at this time. Patient oriented to unit, verbalizes understanding. Patient given support and encouragement. Patient safe on unit with Q15 minute checks for safety.

## 2012-02-28 DIAGNOSIS — F259 Schizoaffective disorder, unspecified: Principal | ICD-10-CM

## 2012-02-28 DIAGNOSIS — F1410 Cocaine abuse, uncomplicated: Secondary | ICD-10-CM

## 2012-02-28 LAB — URINE CULTURE: Culture  Setup Time: 201306130820

## 2012-02-28 MED ORDER — PROMETHAZINE HCL 25 MG/ML IJ SOLN
12.5000 mg | Freq: Four times a day (QID) | INTRAMUSCULAR | Status: DC | PRN
  Administered 2012-02-28 – 2012-03-01 (×5): 25 mg via INTRAMUSCULAR
  Filled 2012-02-28 (×5): qty 1

## 2012-02-28 MED ORDER — PROMETHAZINE HCL 25 MG/ML IJ SOLN: 12.5000 mg | Freq: Four times a day (QID) | INTRAMUSCULAR | Status: DC | PRN

## 2012-02-28 MED ORDER — CIPROFLOXACIN HCL 250 MG PO TABS
250.0000 mg | ORAL_TABLET | Freq: Two times a day (BID) | ORAL | Status: AC
  Administered 2012-02-28 – 2012-03-02 (×6): 250 mg via ORAL
  Filled 2012-02-28 (×6): qty 1

## 2012-02-28 MED ORDER — CIPROFLOXACIN HCL 250 MG PO TABS: 250.0000 mg | ORAL_TABLET | Freq: Two times a day (BID) | ORAL | Status: DC

## 2012-02-28 MED ORDER — GABAPENTIN 300 MG PO CAPS
600.0000 mg | ORAL_CAPSULE | Freq: Four times a day (QID) | ORAL | Status: DC
  Administered 2012-02-28 – 2012-03-04 (×19): 600 mg via ORAL
  Filled 2012-02-28: qty 112
  Filled 2012-02-28 (×15): qty 2
  Filled 2012-02-28: qty 112
  Filled 2012-02-28 (×2): qty 2
  Filled 2012-02-28: qty 112
  Filled 2012-02-28 (×4): qty 2
  Filled 2012-02-28: qty 112
  Filled 2012-02-28 (×5): qty 2

## 2012-02-28 MED ORDER — PANTOPRAZOLE SODIUM 40 MG PO TBEC
40.0000 mg | DELAYED_RELEASE_TABLET | Freq: Every day | ORAL | Status: DC
  Administered 2012-02-29 – 2012-03-03 (×4): 40 mg via ORAL
  Filled 2012-02-28 (×5): qty 1

## 2012-02-28 MED ORDER — ESCITALOPRAM OXALATE 20 MG PO TABS
20.0000 mg | ORAL_TABLET | Freq: Every day | ORAL | Status: DC
  Administered 2012-02-28 – 2012-03-04 (×6): 20 mg via ORAL
  Filled 2012-02-28 (×8): qty 1
  Filled 2012-02-28: qty 14

## 2012-02-28 MED ORDER — GABAPENTIN 400 MG PO CAPS
400.0000 mg | ORAL_CAPSULE | Freq: Two times a day (BID) | ORAL | Status: DC
  Filled 2012-02-28 (×2): qty 1

## 2012-02-28 MED ORDER — RAMELTEON 8 MG PO TABS
8.0000 mg | ORAL_TABLET | Freq: Every day | ORAL | Status: DC
  Administered 2012-02-28 – 2012-03-02 (×4): 8 mg via ORAL
  Filled 2012-02-28 (×7): qty 1

## 2012-02-28 MED ORDER — TRAZODONE HCL 150 MG PO TABS
150.0000 mg | ORAL_TABLET | Freq: Every day | ORAL | Status: DC
  Administered 2012-02-28 – 2012-03-02 (×4): 150 mg via ORAL
  Filled 2012-02-28 (×7): qty 1

## 2012-02-28 NOTE — Progress Notes (Signed)
Pt did not attend d/c planning group on this date.  SW met with pt during treatment team.  Pt states that she stopped taking her meds 4 days ago and became depressed.  Pt states that she is currently homeless.  Pt states that she was living with a boyfriend in [place].  Pt states that she was staying at a homeless shelter in [place] but would like to not return to a shelter.  Pt states that her mom can help financially with housing.  Pt is open to [employer]  SW will provide pt with a list of [employer]  Pt states that she last used cocaine a couple days ago.  Pt states that she was going to [facility] in [place] for medication management.  No further needs voiced by pt at this time.    Reyes Ivan, LCSWA 02/28/2012  11:34 AM

## 2012-02-28 NOTE — Progress Notes (Signed)
Psychoeducational Group Note  Date:  02/28/2012 Time:  2240  Group Topic/Focus:  Wrap-Up Group:   The focus of this group is to help patients review their daily goal of treatment and discuss progress on daily workbooks.  Participation Level:  Did Not Attend  Participation Quality:  Did not attend group  Affect:  Flat  Cognitive:  Alert  Insight:  None  Engagement in Group:  None  Additional Comments:  Pt did not attend wrap up group this evening.  Underwriter announced group and pt did not attend.  Aundria Rud, Perrion Diesel L 02/28/2012, 11:41 PM

## 2012-02-28 NOTE — [facility] Suicide Risk Assessment (Signed)
Suicide Risk Assessment  Admission Assessment     Demographic factors:  Assessment Details Time of Assessment: Admission Information Obtained From: Patient Current Mental Status:  Current Mental Status: Suicidal ideation indicated by patient Loss Factors:  Loss Factors: Loss of significant relationship;Financial problems / change in socioeconomic status Historical Factors:  Historical Factors: Prior suicide attempts;Family history of mental illness or substance abuse Risk Reduction Factors:  Risk Reduction Factors: Responsible for children under 51 years of age;Sense of responsibility to family;Religious beliefs about death;Positive social support  CLINICAL FACTORS:   Severe Anxiety and/or Agitation Bipolar Disorder:   Bipolar II Alcohol/Substance Abuse/Dependencies Schizophrenia:   Less than 32 years old Chronic Pain Previous Psychiatric Diagnoses and Treatments  COGNITIVE FEATURES THAT CONTRIBUTE TO RISK:  Thought constriction (tunnel vision)    SUICIDE RISK:   Moderate:  Frequent suicidal ideation with limited intensity, and duration, some specificity in terms of plans, no associated intent, good self-control, limited dysphoria/symptomatology, some risk factors present, and identifiable protective factors, including available and accessible social support.  Reason for hospitalization: .Detox and referral for ongoing substance abuse rehab. Diagnosis:   Axis I: Schizoaffective Disorder, Substance Induced Mood Disorder and Cocaine and Opiate Dependence Axis II: Deferred Axis III:  Past Medical History  Diagnosis Date  . Depression   . Schizoaffective disorder, bipolar type     ADL's:  Intact  Sleep: Fair  Appetite:  Poor, appetite is suppressed by her nausea  Suicidal Ideation:  Pt denies any suicidal thoughts. Homicidal Ideation:  Denies adamantly any homicidal thoughts.  Mental Status Examination/Evaluation: Objective:  Appearance: Casual  Eye Contact::  Good    Speech:  Clear and Coherent  Volume:  Normal  Mood:  Anxious and Dysphoric  Affect:  Congruent  Thought Process:  Coherent  Orientation:  Full  Thought Content:  WDL  Suicidal Thoughts:  No  Homicidal Thoughts:  No  Memory:  Immediate;   Fair  Judgement:  Impaired  Insight:  Lacking  Psychomotor Activity:  Normal  Concentration:  Fair  Recall:  Fair  Akathisia:  No  Handed:  Right  AIMS (if indicated):     Assets:  Communication Skills Desire for Improvement  Sleep:  Number of Hours: 6    Vital Signs:Blood pressure 100/63, pulse 71, temperature 98.4 F (36.9 C), temperature source Oral, resp. rate 16, height 5\' 8"  (1.727 m), weight 99.791 kg (220 lb), last menstrual period 01/26/2012. Current Medications: Current Facility-Administered Medications  Medication Dose Route Frequency Provider Last Rate Last Dose  . acetaminophen (TYLENOL) tablet 650 mg  650 mg Oral Q6H PRN Mike Craze, MD      . ciprofloxacin (CIPRO) tablet 250 mg  250 mg Oral BID Sanjuana Kava, NP      . dicyclomine (BENTYL) tablet 20 mg  20 mg Oral Q6H PRN Mike Craze, MD      . gabapentin (NEURONTIN) capsule 600 mg  600 mg Oral QID Mike Craze, MD      . hydrOXYzine (ATARAX/VISTARIL) tablet 25 mg  25 mg Oral Q6H PRN Mike Craze, MD   25 mg at 02/28/12 0756  . iloperidone (FANAPT) tablet 2 mg  2 mg Oral BID Mike Craze, MD   2 mg at 02/28/12 0756  . loperamide (IMODIUM) capsule 2-4 mg  2-4 mg Oral PRN Mike Craze, MD      . magnesium hydroxide (MILK OF MAGNESIA) suspension 30 mL  30 mL Oral Daily PRN Mike Craze, MD      .  methocarbamol (ROBAXIN) tablet 500 mg  500 mg Oral Q8H PRN Mike Craze, MD      . naproxen (NAPROSYN) tablet 500 mg  500 mg Oral BID PRN Mike Craze, MD   500 mg at 02/28/12 1211  . pantoprazole (PROTONIX) EC tablet 40 mg  40 mg Oral Q1200 Sanjuana Kava, NP      . promethazine (PHENERGAN) injection 12.5-25 mg  12.5-25 mg Intramuscular Q6H PRN Mike Craze, MD       . ramelteon (ROZEREM) tablet 8 mg  8 mg Oral QHS Mike Craze, MD      . traZODone (DESYREL) tablet 150 mg  150 mg Oral QHS Mike Craze, MD      . DISCONTD: ciprofloxacin (CIPRO) tablet 250 mg  250 mg Oral BID Sanjuana Kava, NP      . DISCONTD: cloNIDine (CATAPRES - Dosed in mg/24 hr) patch 0.2 mg  0.2 mg Transdermal Weekly Mike Craze, MD   0.2 mg at 02/27/12 2332  . DISCONTD: gabapentin (NEURONTIN) capsule 400 mg  400 mg Oral QID Mike Craze, MD   400 mg at 02/28/12 1201  . DISCONTD: gabapentin (NEURONTIN) capsule 400 mg  400 mg Oral BID Sanjuana Kava, NP      . DISCONTD: ondansetron (ZOFRAN-ODT) disintegrating tablet 4 mg  4 mg Oral Q6H PRN Mike Craze, MD      . DISCONTD: pantoprazole (PROTONIX) EC tablet 40 mg  40 mg Oral Q1200 Mike Craze, MD   40 mg at 02/28/12 1201  . DISCONTD: promethazine (PHENERGAN) injection 12.5-25 mg  12.5-25 mg Intramuscular Q6H PRN Mike Craze, MD      . DISCONTD: traZODone (DESYREL) tablet 50 mg  50 mg Oral QHS,MR X 1 Verne Spurr, PA-C   50 mg at 02/27/12 2332   Facility-Administered Medications Ordered in Other Encounters  Medication Dose Route Frequency Provider Last Rate Last Dose  . DISCONTD: acetaminophen (TYLENOL) tablet 650 mg  650 mg Oral Q4H PRN Cyndra Numbers, MD      . DISCONTD: ALPRAZolam Prudy Feeler) tablet 0.25 mg  0.25 mg Oral QHS PRN Cyndra Numbers, MD   0.25 mg at 02/27/12 2107  . DISCONTD: alum & mag hydroxide-simeth (MAALOX/MYLANTA) 200-200-20 MG/5ML suspension 30 mL  30 mL Oral PRN Cyndra Numbers, MD      . DISCONTD: gabapentin (NEURONTIN) capsule 400 mg  400 mg Oral BID Cyndra Numbers, MD   400 mg at 02/27/12 2107  . DISCONTD: ibuprofen (ADVIL,MOTRIN) tablet 600 mg  600 mg Oral Q8H PRN Cyndra Numbers, MD      . DISCONTD: iloperidone (FANAPT) tablet 2 mg  2 mg Oral BID Cyndra Numbers, MD      . DISCONTD: pantoprazole (PROTONIX) EC tablet 40 mg  40 mg Oral Q1200 Cyndra Numbers, MD   40 mg at 02/27/12 1625  . DISCONTD: potassium chloride SA  (K-DUR,KLOR-CON) CR tablet 40 mEq  40 mEq Oral Once Cyndra Numbers, MD      . DISCONTD: promethazine (PHENERGAN) tablet 25 mg  25 mg Oral Q6H PRN Cyndra Numbers, MD   25 mg at 02/27/12 1421  . DISCONTD: traZODone (DESYREL) tablet 150 mg  150 mg Oral QHS Cyndra Numbers, MD      . DISCONTD: zolpidem (AMBIEN) tablet 5 mg  5 mg Oral QHS PRN Cyndra Numbers, MD        Lab Results:  Results for orders placed during the hospital encounter of 02/27/12 (from the past  48 hour(s))  URINALYSIS, ROUTINE W REFLEX MICROSCOPIC     Status: Abnormal   Collection Time   02/27/12  2:57 AM      Component Value Range Comment   Color, Urine AMBER (*) YELLOW BIOCHEMICALS MAY BE AFFECTED BY COLOR   APPearance CLOUDY (*) CLEAR    Specific Gravity, Urine 1.030  1.005 - 1.030    pH 6.0  5.0 - 8.0    Glucose, UA NEGATIVE  NEGATIVE mg/dL    Hgb urine dipstick NEGATIVE  NEGATIVE    Bilirubin Urine MODERATE (*) NEGATIVE    Ketones, ur >80 (*) NEGATIVE mg/dL    Protein, ur NEGATIVE  NEGATIVE mg/dL    Urobilinogen, UA 1.0  0.0 - 1.0 mg/dL    Nitrite NEGATIVE  NEGATIVE    Leukocytes, UA SMALL (*) NEGATIVE   URINE RAPID DRUG SCREEN (HOSP PERFORMED)     Status: Abnormal   Collection Time   02/27/12  2:57 AM      Component Value Range Comment   Opiates POSITIVE (*) NONE DETECTED    Cocaine POSITIVE (*) NONE DETECTED    Benzodiazepines NONE DETECTED  NONE DETECTED    Amphetamines NONE DETECTED  NONE DETECTED    Tetrahydrocannabinol NONE DETECTED  NONE DETECTED    Barbiturates NONE DETECTED  NONE DETECTED   URINE MICROSCOPIC-ADD ON     Status: Abnormal   Collection Time   02/27/12  2:57 AM      Component Value Range Comment   Squamous Epithelial / LPF FEW (*) RARE    WBC, UA 0-2  <3 WBC/hpf    Bacteria, UA MANY (*) RARE    Crystals CA OXALATE CRYSTALS (*) NEGATIVE   URINE CULTURE     Status: Normal   Collection Time   02/27/12  2:57 AM      Component Value Range Comment   Specimen Description URINE, RANDOM      Special  Requests ADDED AT 0429      Culture  Setup Time [id number]      Colony Count 40,000 COLONIES/ML      Culture        Value: Multiple bacterial morphotypes present, none predominant. Suggest appropriate recollection if clinically indicated.   Report Status 02/28/2012 FINAL     CBC     Status: Abnormal   Collection Time   02/27/12  3:00 AM      Component Value Range Comment   WBC 15.2 (*) 4.0 - 10.5 K/uL    RBC 4.57  3.87 - 5.11 MIL/uL    Hemoglobin 13.6  12.0 - 15.0 g/dL    HCT 81.1  91.4 - 78.2 %    MCV 87.3  78.0 - 100.0 fL    MCH 29.8  26.0 - 34.0 pg    MCHC 34.1  30.0 - 36.0 g/dL    RDW 95.6  21.3 - 08.6 %    Platelets 263  150 - 400 K/uL   COMPREHENSIVE METABOLIC PANEL     Status: Abnormal   Collection Time   02/27/12  3:00 AM      Component Value Range Comment   Sodium 143  135 - 145 mEq/L    Potassium 3.0 (*) 3.5 - 5.1 mEq/L    Chloride 104  96 - 112 mEq/L    CO2 22  19 - 32 mEq/L    Glucose, Bld 106 (*) 70 - 99 mg/dL    BUN 10  6 - 23 mg/dL  Creatinine, Ser 0.82  0.50 - 1.10 mg/dL    Calcium 9.7  8.4 - 45.4 mg/dL    Total Protein 7.2  6.0 - 8.3 g/dL    Albumin 4.1  3.5 - 5.2 g/dL    AST 36  0 - 37 U/L    ALT 58 (*) 0 - 35 U/L    Alkaline Phosphatase 68  39 - 117 U/L    Total Bilirubin 0.4  0.3 - 1.2 mg/dL    GFR calc non Af Amer >90  >90 mL/min    GFR calc Af Amer >90  >90 mL/min   ETHANOL     Status: Normal   Collection Time   02/27/12  3:00 AM      Component Value Range Comment   Alcohol, Ethyl (B) <11  0 - 11 mg/dL   ACETAMINOPHEN LEVEL     Status: Normal   Collection Time   02/27/12  3:00 AM      Component Value Range Comment   Acetaminophen (Tylenol), Serum <15.0  10 - 30 ug/mL   SALICYLATE LEVEL     Status: Abnormal   Collection Time   02/27/12  3:00 AM      Component Value Range Comment   Salicylate Lvl 2.1 (*) 2.8 - 20.0 mg/dL   POCT I-STAT TROPONIN I     Status: Normal   Collection Time   02/27/12  3:15 AM      Component Value Range Comment    Troponin i, poc 0.02  0.00 - 0.08 ng/mL    Comment 3            POCT PREGNANCY, URINE     Status: Normal   Collection Time   02/27/12  3:18 AM      Component Value Range Comment   Preg Test, Ur NEGATIVE  NEGATIVE   POCT I-STAT TROPONIN I     Status: Normal   Collection Time   02/27/12  6:26 AM      Component Value Range Comment   Troponin i, poc 0.00  0.00 - 0.08 ng/mL    Comment 3              Physical Findings: AIMS:  , ,  ,  ,    CIWA:  CIWA-Ar Total: 5  COWS:     Risk: Risk of harm to self is elevated by her diagnoses and her addictions.  (Cocaine use prompted by her opiate re-exposure.)  Risk of harm to others is minimal in that she has not been involved in fights or had any legal charges filed on her.  Treatment Plan Summary: Daily contact with patient to assess and evaluate symptoms and progress in treatment Medication management No signs/symptoms of withdrawal and mood/anxiety less than 3/10 where 1 is the best and 10 is the worst  Plan: Admit, Adjust medications for optimal control of anxiety, insomnia, and nausea.  Discussed the risks, benefits, and probable clinical course with and without treatment.  Pt is agreeable to the current course of treatment. We will continue on q. 15 checks the unit protocol. At this time there is no clinical indication for one-to-one observation as patient contract for safety and presents little risk to harm themself and others.  We will increase collateral information. I encourage patient to participate in group milieu therapy. Pt will be seen in treatment team soon for further treatment and appropriate discharge planning. Please see history and physical note for more detailed information ELOS: 3 to  5 days.   Rebacca Votaw 02/28/2012, 4:25 PM

## 2012-02-28 NOTE — Progress Notes (Signed)
[facility] MD Progress Note  02/28/2012 4:16 PM  Diagnosis:   Axis I: Schizoaffective Disorder, Substance Induced Mood Disorder and Cocaine and Opiate Dependence Axis II: Deferred Axis III:  Past Medical History  Diagnosis Date  . Depression   . Schizoaffective disorder, bipolar type     ADL's:  Intact  Sleep: Fair  Appetite:  Poor, appetite is suppressed by her nausea  Suicidal Ideation:  Pt denies any suicidal thoughts. Homicidal Ideation:  Denies adamantly any homicidal thoughts.  Mental Status Examination/Evaluation: Objective:  Appearance: Casual  Eye Contact::  Good  Speech:  Clear and Coherent  Volume:  Normal  Mood:  Anxious and Dysphoric  Affect:  Congruent  Thought Process:  Coherent  Orientation:  Full  Thought Content:  WDL  Suicidal Thoughts:  No  Homicidal Thoughts:  No  Memory:  Immediate;   Fair  Judgement:  Impaired  Insight:  Lacking  Psychomotor Activity:  Normal  Concentration:  Fair  Recall:  Fair  Akathisia:  No  Handed:  Right  AIMS (if indicated):     Assets:  Communication Skills Desire for Improvement  Sleep:  Number of Hours: 6    Vital Signs:Blood pressure 100/63, pulse 71, temperature 98.4 F (36.9 C), temperature source Oral, resp. rate 16, height 5\' 8"  (1.727 m), weight 99.791 kg (220 lb), last menstrual period 01/26/2012. Current Medications: Current Facility-Administered Medications  Medication Dose Route Frequency Provider Last Rate Last Dose  . acetaminophen (TYLENOL) tablet 650 mg  650 mg Oral Q6H PRN Mike Craze, MD      . ciprofloxacin (CIPRO) tablet 250 mg  250 mg Oral BID Sanjuana Kava, NP      . dicyclomine (BENTYL) tablet 20 mg  20 mg Oral Q6H PRN Mike Craze, MD      . gabapentin (NEURONTIN) capsule 600 mg  600 mg Oral QID Mike Craze, MD      . hydrOXYzine (ATARAX/VISTARIL) tablet 25 mg  25 mg Oral Q6H PRN Mike Craze, MD   25 mg at 02/28/12 0756  . iloperidone (FANAPT) tablet 2 mg  2 mg Oral BID Mike Craze, MD   2 mg at 02/28/12 0756  . loperamide (IMODIUM) capsule 2-4 mg  2-4 mg Oral PRN Mike Craze, MD      . magnesium hydroxide (MILK OF MAGNESIA) suspension 30 mL  30 mL Oral Daily PRN Mike Craze, MD      . methocarbamol (ROBAXIN) tablet 500 mg  500 mg Oral Q8H PRN Mike Craze, MD      . naproxen (NAPROSYN) tablet 500 mg  500 mg Oral BID PRN Mike Craze, MD   500 mg at 02/28/12 1211  . pantoprazole (PROTONIX) EC tablet 40 mg  40 mg Oral Q1200 Sanjuana Kava, NP      . promethazine (PHENERGAN) injection 12.5-25 mg  12.5-25 mg Intramuscular Q6H PRN Mike Craze, MD      . ramelteon (ROZEREM) tablet 8 mg  8 mg Oral QHS Mike Craze, MD      . traZODone (DESYREL) tablet 150 mg  150 mg Oral QHS Mike Craze, MD      . DISCONTD: ciprofloxacin (CIPRO) tablet 250 mg  250 mg Oral BID Sanjuana Kava, NP      . DISCONTD: cloNIDine (CATAPRES - Dosed in mg/24 hr) patch 0.2 mg  0.2 mg Transdermal Weekly Mike Craze, MD   0.2 mg at 02/27/12 2332  .  DISCONTD: gabapentin (NEURONTIN) capsule 400 mg  400 mg Oral QID Mike Craze, MD   400 mg at 02/28/12 1201  . DISCONTD: gabapentin (NEURONTIN) capsule 400 mg  400 mg Oral BID Sanjuana Kava, NP      . DISCONTD: ondansetron (ZOFRAN-ODT) disintegrating tablet 4 mg  4 mg Oral Q6H PRN Mike Craze, MD      . DISCONTD: pantoprazole (PROTONIX) EC tablet 40 mg  40 mg Oral Q1200 Mike Craze, MD   40 mg at 02/28/12 1201  . DISCONTD: promethazine (PHENERGAN) injection 12.5-25 mg  12.5-25 mg Intramuscular Q6H PRN Mike Craze, MD      . DISCONTD: traZODone (DESYREL) tablet 50 mg  50 mg Oral QHS,MR X 1 Verne Spurr, PA-C   50 mg at 02/27/12 2332   Facility-Administered Medications Ordered in Other Encounters  Medication Dose Route Frequency Provider Last Rate Last Dose  . DISCONTD: acetaminophen (TYLENOL) tablet 650 mg  650 mg Oral Q4H PRN Cyndra Numbers, MD      . DISCONTD: ALPRAZolam Prudy Feeler) tablet 0.25 mg  0.25 mg Oral QHS PRN Cyndra Numbers,  MD   0.25 mg at 02/27/12 2107  . DISCONTD: alum & mag hydroxide-simeth (MAALOX/MYLANTA) 200-200-20 MG/5ML suspension 30 mL  30 mL Oral PRN Cyndra Numbers, MD      . DISCONTD: gabapentin (NEURONTIN) capsule 400 mg  400 mg Oral BID Cyndra Numbers, MD   400 mg at 02/27/12 2107  . DISCONTD: ibuprofen (ADVIL,MOTRIN) tablet 600 mg  600 mg Oral Q8H PRN Cyndra Numbers, MD      . DISCONTD: iloperidone (FANAPT) tablet 2 mg  2 mg Oral BID Cyndra Numbers, MD      . DISCONTD: pantoprazole (PROTONIX) EC tablet 40 mg  40 mg Oral Q1200 Cyndra Numbers, MD   40 mg at 02/27/12 1625  . DISCONTD: potassium chloride SA (K-DUR,KLOR-CON) CR tablet 40 mEq  40 mEq Oral Once Cyndra Numbers, MD      . DISCONTD: promethazine (PHENERGAN) tablet 25 mg  25 mg Oral Q6H PRN Cyndra Numbers, MD   25 mg at 02/27/12 1421  . DISCONTD: traZODone (DESYREL) tablet 150 mg  150 mg Oral QHS Cyndra Numbers, MD      . DISCONTD: zolpidem (AMBIEN) tablet 5 mg  5 mg Oral QHS PRN Cyndra Numbers, MD        Lab Results:  Results for orders placed during the hospital encounter of 02/27/12 (from the past 48 hour(s))  URINALYSIS, ROUTINE W REFLEX MICROSCOPIC     Status: Abnormal   Collection Time   02/27/12  2:57 AM      Component Value Range Comment   Color, Urine AMBER (*) YELLOW BIOCHEMICALS MAY BE AFFECTED BY COLOR   APPearance CLOUDY (*) CLEAR    Specific Gravity, Urine 1.030  1.005 - 1.030    pH 6.0  5.0 - 8.0    Glucose, UA NEGATIVE  NEGATIVE mg/dL    Hgb urine dipstick NEGATIVE  NEGATIVE    Bilirubin Urine MODERATE (*) NEGATIVE    Ketones, ur >80 (*) NEGATIVE mg/dL    Protein, ur NEGATIVE  NEGATIVE mg/dL    Urobilinogen, UA 1.0  0.0 - 1.0 mg/dL    Nitrite NEGATIVE  NEGATIVE    Leukocytes, UA SMALL (*) NEGATIVE   URINE RAPID DRUG SCREEN (HOSP PERFORMED)     Status: Abnormal   Collection Time   02/27/12  2:57 AM      Component Value Range Comment   Opiates  POSITIVE (*) NONE DETECTED    Cocaine POSITIVE (*) NONE DETECTED    Benzodiazepines NONE DETECTED  NONE  DETECTED    Amphetamines NONE DETECTED  NONE DETECTED    Tetrahydrocannabinol NONE DETECTED  NONE DETECTED    Barbiturates NONE DETECTED  NONE DETECTED   URINE MICROSCOPIC-ADD ON     Status: Abnormal   Collection Time   02/27/12  2:57 AM      Component Value Range Comment   Squamous Epithelial / LPF FEW (*) RARE    WBC, UA 0-2  <3 WBC/hpf    Bacteria, UA MANY (*) RARE    Crystals CA OXALATE CRYSTALS (*) NEGATIVE   URINE CULTURE     Status: Normal   Collection Time   02/27/12  2:57 AM      Component Value Range Comment   Specimen Description URINE, RANDOM      Special Requests ADDED AT 0429      Culture  Setup Time [id number]      Colony Count 40,000 COLONIES/ML      Culture        Value: Multiple bacterial morphotypes present, none predominant. Suggest appropriate recollection if clinically indicated.   Report Status 02/28/2012 FINAL     CBC     Status: Abnormal   Collection Time   02/27/12  3:00 AM      Component Value Range Comment   WBC 15.2 (*) 4.0 - 10.5 K/uL    RBC 4.57  3.87 - 5.11 MIL/uL    Hemoglobin 13.6  12.0 - 15.0 g/dL    HCT 95.6  21.3 - 08.6 %    MCV 87.3  78.0 - 100.0 fL    MCH 29.8  26.0 - 34.0 pg    MCHC 34.1  30.0 - 36.0 g/dL    RDW 57.8  46.9 - 62.9 %    Platelets 263  150 - 400 K/uL   COMPREHENSIVE METABOLIC PANEL     Status: Abnormal   Collection Time   02/27/12  3:00 AM      Component Value Range Comment   Sodium 143  135 - 145 mEq/L    Potassium 3.0 (*) 3.5 - 5.1 mEq/L    Chloride 104  96 - 112 mEq/L    CO2 22  19 - 32 mEq/L    Glucose, Bld 106 (*) 70 - 99 mg/dL    BUN 10  6 - 23 mg/dL    Creatinine, Ser 5.28  0.50 - 1.10 mg/dL    Calcium 9.7  8.4 - 41.3 mg/dL    Total Protein 7.2  6.0 - 8.3 g/dL    Albumin 4.1  3.5 - 5.2 g/dL    AST 36  0 - 37 U/L    ALT 58 (*) 0 - 35 U/L    Alkaline Phosphatase 68  39 - 117 U/L    Total Bilirubin 0.4  0.3 - 1.2 mg/dL    GFR calc non Af Amer >90  >90 mL/min    GFR calc Af Amer >90  >90 mL/min   ETHANOL      Status: Normal   Collection Time   02/27/12  3:00 AM      Component Value Range Comment   Alcohol, Ethyl (B) <11  0 - 11 mg/dL   ACETAMINOPHEN LEVEL     Status: Normal   Collection Time   02/27/12  3:00 AM      Component Value Range Comment   Acetaminophen (Tylenol), Serum <  15.0  10 - 30 ug/mL   SALICYLATE LEVEL     Status: Abnormal   Collection Time   02/27/12  3:00 AM      Component Value Range Comment   Salicylate Lvl 2.1 (*) 2.8 - 20.0 mg/dL   POCT I-STAT TROPONIN I     Status: Normal   Collection Time   02/27/12  3:15 AM      Component Value Range Comment   Troponin i, poc 0.02  0.00 - 0.08 ng/mL    Comment 3            POCT PREGNANCY, URINE     Status: Normal   Collection Time   02/27/12  3:18 AM      Component Value Range Comment   Preg Test, Ur NEGATIVE  NEGATIVE   POCT I-STAT TROPONIN I     Status: Normal   Collection Time   02/27/12  6:26 AM      Component Value Range Comment   Troponin i, poc 0.00  0.00 - 0.08 ng/mL    Comment 3              Physical Findings: AIMS:  , ,  ,  ,    CIWA:  CIWA-Ar Total: 5  COWS:     Treatment Plan Summary: Daily contact with patient to assess and evaluate symptoms and progress in treatment Medication management No signs/symptoms of withdrawal and mood/anxiety less than 3/10 where 1 is the best and 10 is the worst  Plan: Admit, Adjust medications for optimal control of anxiety, insomnia, and nausea.  Patricia [name] 02/28/2012, 4:16 PM

## 2012-02-28 NOTE — Progress Notes (Signed)
Patient resting in room upon assessment.  Complains of feeling tired and having intermittent nausea.  Denies SI/HI and is cooperative.  Safety maintained on unit.

## 2012-02-28 NOTE — Progress Notes (Signed)
[facility] Group Notes:  (Counselor/Nursing/MHT/Case Management/Adjunct)  02/28/2012 1:15 PM  Type of Therapy:  Group Therapy, Dance/Movement Therapy   Participation Level:  Did Not Attend   Alexis Mizuno  

## 2012-02-28 NOTE — H&P (Signed)
Psychiatric Admission Assessment Adult  Patient Identification:  Patricia [name]  Date of Evaluation:  02/28/2012  Chief Complaint:  Schizoaffective Disorder, Bipolar Type   History of Present Illness: This is a 36 year old Caucasian female, admitted to [facility] from the [facility] ED with complaints of cocaine induced chest pains and SOB. Patient reports, "I have not been taking my medicines x 4 days. I thought I would be okay without them. However, the result was that I became very agitated instead. I was suppose to be on Neurontin 400 mg bid, Lexapro 20 mg daily, Trazodone 150 mg Q bedtime, Ambien 5 mg Q bedtime and Valium 2 mg bid. I have Bipolar disorder and Schizophrenia. And because I have not taken my medicines x 4 days, I started hearing voices again a couple of days ago. When I stopped using my medicines, I was doing cocaine instead and during the time that I was doing cocaine, I started to have chest pains and shortness of breath. That was why I was taken to the ED. This is my 2 time of being in the hospital for my mental illness. I had also been to [facility] and a hospital in [place]. All these happened about 1 year ago. I have an ovarian Cyst. I take Percocet for that, and valium 2 mg bid for my anxiety"  ROS: Patricia [name] is alert and oriented x 3. She is also aware of situation. She currently denies SOB and or chest pains. Skin is without significant signs of breakdown.  Mood Symptoms:  SI,  Depression Symptoms:  psychomotor agitation,  (Hypo) Manic Symptoms:  Irritable Mood,  Anxiety Symptoms:  Excessive Worry,  Psychotic Symptoms:  Hallucinations: Auditory 4 days ago  PTSD Symptoms: Had a traumatic exposure:  Denies sny taumatic events in her life  Past Psychiatric History: Diagnosis: Schizoaffective disorder, Cocaine abuse  Hospitalizations: [facility] x 2 including this admission  Outpatient Care:   Substance Abuse Care: None reported  Self-Mutilation: Denies     Suicidal Attempts: Denies attempt, admits thoughts  Violent Behaviors: Denies   Past Medical History:   Past Medical History  Diagnosis Date  . Depression   . Schizoaffective disorder, bipolar type    Allergies:   Allergies  Allergen Reactions  . Penicillins Anaphylaxis  . Rocephin (Ceftriaxone Sodium In Dextrose) Anaphylaxis  . Ativan (Lorazepam)     Sensitive. Made patient feel "crazy"  . Zofran (Ondansetron Hcl) Other (See Comments)    Makes patient skin turn red and feels hot to touch   PTA Medications: Prescriptions prior to admission  Medication Sig Dispense Refill  . diazepam (VALIUM) 5 MG tablet Take 10 mg by mouth 3 (three) times daily.      Marland Kitchen gabapentin (NEURONTIN) 400 MG capsule Take 400 mg by mouth 2 (two) times daily.      Marland Kitchen ibuprofen (ADVIL,MOTRIN) 800 MG tablet Take 800 mg by mouth 3 (three) times daily. Pain.      . Iloperidone (FANAPT) 2 MG TABS Take 2 mg by mouth 2 (two) times daily.      Marland Kitchen omeprazole (PRILOSEC) 20 MG capsule Take 20 mg by mouth daily.      Marland Kitchen zolpidem (AMBIEN) 10 MG tablet Take 10 mg by mouth at bedtime as needed.      Marland Kitchen oxyCODONE-acetaminophen (PERCOCET) 5-325 MG per tablet Take 2 tablets by mouth every 4 (four) hours as needed. For pain        Substance Abuse History in the last 12  months: Substance Age of 1st Use Last Use Amount Specific Type  Nicotine Denies use     Alcohol 20 08 months ago    Cannabis Denies use     Opiates "I take percocet for pain"     Cocaine 30 4 days ago  Crack  Methamphetamines Denies use     LSD Denies use     Ecstasy Denies use     Benzodiazepines Denies use     Caffeine      Inhalants      Others:                         Consequences of Substance Abuse: Medical Consequences:  Liver damage Legal Consequences:  Arrests, jail time Family Consequences:  Family discord  Social History: Current Place of Residence:  [place] of Birth: [place]  Family Members: "My 3 children"  Marital  Status:  Divorced  Children:3  Sons:2  Daughters: 1  Relationships: "I'm divorced"  Education:  Special educational needs teacher Problems/Performance: None reported  Religious Beliefs/Practices:None reported  History of Abuse (Emotional/Phsycial/Sexual): None reported  Occupational Experiences: English as a second language teacher History:  None.  Legal History: None reported  Hobbies/Interests: None reported  Family History:   Family History  Problem Relation Age of Onset  . Diabetes Mother   . Hypertension Mother   . Hypertension Father     Mental Status Examination/Evaluation: Objective:  Appearance: Casual  Eye Contact::  Good  Speech:  Clear and Coherent  Volume:  Normal  Mood:  Depressed  Affect:  Flat  Thought Process:  Coherent  Orientation:  Full  Thought Content:  Rumination  Suicidal Thoughts:  No  Homicidal Thoughts:  No  Memory:  Immediate;   Good Recent;   Good Remote;   Good  Judgement:  Poor  Insight:  Fair  Psychomotor Activity:  Normal  Concentration:  Good  Recall:  Good  Akathisia:  No  Handed:  Right  AIMS (if indicated):     Assets:  Desire for Improvement  Sleep:  Number of Hours: 6     Laboratory/X-Ray: None Psychological Evaluation(s)      Assessment:    AXIS I:  Schizoaffective Disorder, Cocaine abuse AXIS II:  Deferred AXIS III:   Past Medical History  Diagnosis Date  . Depression   . Schizoaffective disorder, bipolar type    AXIS IV:  other psychosocial or environmental problems AXIS V:  11-20 some danger of hurting self or others possible OR occasionally fails to maintain minimal personal hygiene OR gross impairment in communication  Treatment Plan/Recommendations: Admit for safety and stabilization. Review and reinstate any pertinent home medications for other health conditions. Start Cipro 500 mg bid x 3 days for uti.  Treatment Plan Summary: Daily contact with patient to assess and evaluate symptoms and progress in  treatment Medication management  Current Medications:  Current Facility-Administered Medications  Medication Dose Route Frequency Provider Last Rate Last Dose  . acetaminophen (TYLENOL) tablet 650 mg  650 mg Oral Q6H PRN Mike Craze, MD      . cloNIDine (CATAPRES - Dosed in mg/24 hr) patch 0.2 mg  0.2 mg Transdermal Weekly Mike Craze, MD   0.2 mg at 02/27/12 2332  . dicyclomine (BENTYL) tablet 20 mg  20 mg Oral Q6H PRN Mike Craze, MD      . gabapentin (NEURONTIN) capsule 400 mg  400 mg Oral QID Mike Craze, MD  400 mg at 02/28/12 1201  . hydrOXYzine (ATARAX/VISTARIL) tablet 25 mg  25 mg Oral Q6H PRN Mike Craze, MD   25 mg at 02/28/12 0756  . iloperidone (FANAPT) tablet 2 mg  2 mg Oral BID Mike Craze, MD   2 mg at 02/28/12 0756  . loperamide (IMODIUM) capsule 2-4 mg  2-4 mg Oral PRN Mike Craze, MD      . magnesium hydroxide (MILK OF MAGNESIA) suspension 30 mL  30 mL Oral Daily PRN Mike Craze, MD      . methocarbamol (ROBAXIN) tablet 500 mg  500 mg Oral Q8H PRN Mike Craze, MD      . naproxen (NAPROSYN) tablet 500 mg  500 mg Oral BID PRN Mike Craze, MD   500 mg at 02/28/12 1211  . pantoprazole (PROTONIX) EC tablet 40 mg  40 mg Oral Q1200 Mike Craze, MD   40 mg at 02/28/12 1201  . traZODone (DESYREL) tablet 50 mg  50 mg Oral QHS,MR X 1 Verne Spurr, PA-C   50 mg at 02/27/12 2332  . DISCONTD: ondansetron (ZOFRAN-ODT) disintegrating tablet 4 mg  4 mg Oral Q6H PRN Mike Craze, MD       Facility-Administered Medications Ordered in Other Encounters  Medication Dose Route Frequency Provider Last Rate Last Dose  . DISCONTD: acetaminophen (TYLENOL) tablet 650 mg  650 mg Oral Q4H PRN Cyndra Numbers, MD      . DISCONTD: ALPRAZolam Prudy Feeler) tablet 0.25 mg  0.25 mg Oral QHS PRN Cyndra Numbers, MD   0.25 mg at 02/27/12 2107  . DISCONTD: alum & mag hydroxide-simeth (MAALOX/MYLANTA) 200-200-20 MG/5ML suspension 30 mL  30 mL Oral PRN Cyndra Numbers, MD      . DISCONTD:  gabapentin (NEURONTIN) capsule 400 mg  400 mg Oral BID Cyndra Numbers, MD   400 mg at 02/27/12 2107  . DISCONTD: ibuprofen (ADVIL,MOTRIN) tablet 600 mg  600 mg Oral Q8H PRN Cyndra Numbers, MD      . DISCONTD: iloperidone (FANAPT) tablet 2 mg  2 mg Oral BID Cyndra Numbers, MD      . DISCONTD: pantoprazole (PROTONIX) EC tablet 40 mg  40 mg Oral Q1200 Cyndra Numbers, MD   40 mg at 02/27/12 1625  . DISCONTD: potassium chloride SA (K-DUR,KLOR-CON) CR tablet 40 mEq  40 mEq Oral Once Cyndra Numbers, MD      . DISCONTD: promethazine (PHENERGAN) tablet 25 mg  25 mg Oral Q6H PRN Cyndra Numbers, MD   25 mg at 02/27/12 1421  . DISCONTD: traZODone (DESYREL) tablet 150 mg  150 mg Oral QHS Cyndra Numbers, MD      . DISCONTD: zolpidem (AMBIEN) tablet 5 mg  5 mg Oral QHS PRN Cyndra Numbers, MD        Observation Level/Precautions:  Q 15 minutes checks for safety  Laboratory:  UTI,   Psychotherapy:  Group  Medications:  See lists  Routine PRN Medications:  Yes  Consultations: None indicated   Discharge Concerns:  Safety  Other:     Sanjuana Kava 6/14/20131:58 PM

## 2012-02-28 NOTE — Tx Team (Signed)
Interdisciplinary Treatment Plan Update (Adult)  Date:  02/28/2012  Time Reviewed:  11:22 AM   Progress in Treatment: Attending groups: Yes Participating in groups:  Yes Taking medication as prescribed: Yes Tolerating medication:  Yes Family/Significant other contact made:  Counselor assessing for appropriate contact Patient understands diagnosis:  Yes Discussing patient identified problems/goals with staff:  Yes Medical problems stabilized or resolved:  Yes Denies suicidal/homicidal ideation: Yes Issues/concerns per patient self-inventory:  None identified Other: N/A  New problem(s) identified: None Identified  Reason for Continuation of Hospitalization: Anxiety Depression Medication stabilization Suicidal ideation  Interventions implemented related to continuation of hospitalization: mood stabilization, medication monitoring and adjustment, group therapy and psycho education, safety checks q 15 mins  Additional comments: N/A  Estimated length of stay: 3-5 days  Discharge Plan: SW will assess for appropriate referrals.    New goal(s): N/A  Review of initial/current patient goals per problem list:    1.  Goal(s): Reduce depressive symptoms  Met:  No  Target date: by discharge  As evidenced by: Reducing depression from a 10 to a 3 as reported by pt.   2.  Goal (s): Reduce/Eliminate suicidal ideation  Met:  No  Target date: by discharge  As evidenced by: pt reporting no SI.    3.  Goal(s): Reduce anxiety symptoms  Met:  No  Target date: by discharge  As evidenced by: Reduce anxiety from a 10 to a 3 as reported by pt.    Attendees: Patient:  Patricia [name] 02/28/2012 11:23 AM   Family:     Physician:  Orson Aloe, MD  02/28/2012  11:22 AM   Nursing:   Nestor Ramp, RN 02/28/2012 11:24 AM   Case Manager:  Reyes Ivan, LCSWA 02/28/2012  11:22 AM   Counselor:   02/28/2012  11:22 AM   Other:  02/28/2012  11:22 AM   Other:  02/28/2012  11:22 AM   Other:       Other:      Scribe for Treatment Team:   Carmina Miller, 02/28/2012 , 11:22 AM

## 2012-02-28 NOTE — H&P (Signed)
Medical/psychiatric screening examination/treatment/procedure(s) were performed by non-physician practitioner and as supervising physician I was immediately available for consultation/collaboration.  I have seen and examined this patient and agree the major elements of this evaluation.  

## 2012-02-29 NOTE — Progress Notes (Signed)
Pt up in day room interacting with other patients appropriately. Pt c/o of "feeling like I am going to jump out of my skin" per pt she is "coming off of my valium" pt given Vistaril for anxiety. Pt stayed out of bed to have snack and then returned to bed. Pt maintained good eye contact during conversations, did not want to talk to this nurse about her problems.

## 2012-02-29 NOTE — Progress Notes (Signed)
  Patricia [name] is a 36 y.o. female [id number] [birth year]  02/27/2012 Principal Problem:  *Cocaine abuse Active Problems:  Schizoaffective disorder, bipolar type  Opiate use   Mental Status: Mood is depressed and feels very anxious. Denies SI/Hi /AVH    Subjective/Objective: Seen in room in bed. Feels anxious does not associate with SA thinks it is because she was non-compliant with psych meds.    Filed Vitals:   02/29/12 0731  BP: 104/66  Pulse: 112  Temp:   Resp:     Lab Results:   BMET    Component Value Date/Time   NA 143 02/27/2012 0300   K 3.0* 02/27/2012 0300   CL 104 02/27/2012 0300   CO2 22 02/27/2012 0300   GLUCOSE 106* 02/27/2012 0300   BUN 10 02/27/2012 0300   CREATININE 0.82 02/27/2012 0300   CALCIUM 9.7 02/27/2012 0300   GFRNONAA >90 02/27/2012 0300   GFRAA >90 02/27/2012 0300    Medications:  Scheduled:     . ciprofloxacin  250 mg Oral BID  . escitalopram  20 mg Oral Daily  . gabapentin  600 mg Oral QID  . iloperidone  2 mg Oral BID  . pantoprazole  40 mg Oral Q1200  . ramelteon  8 mg Oral QHS  . traZODone  150 mg Oral QHS  . DISCONTD: cloNIDine  0.2 mg Transdermal Weekly  . DISCONTD: gabapentin  400 mg Oral QID  . DISCONTD: traZODone  50 mg Oral QHS,MR X 1     PRN Meds acetaminophen, dicyclomine, hydrOXYzine, loperamide, magnesium hydroxide, methocarbamol, naproxen, promethazine, DISCONTD: promethazine  Plan: continue current plan of care. Maynard David,MICKIE D. 02/29/2012

## 2012-02-29 NOTE — Progress Notes (Signed)
Patient ID: Patricia [name], female   DOB: 1976/08/25, 36 y.o.   MRN: [id number]   Patient lying in bed with eyes closed. Appears to be sleeping. Respirations even and non-labored. Staff will monitor throughout shift.

## 2012-02-29 NOTE — Progress Notes (Signed)
D Pt is seen out in the milieu...interacting with the other pts...tolerated well. She chose not to complete her self inventory this morning. She has spent much of her time in her bed today...focused on somatic discomforts and preoccupied with getting out of the hospital.  A She is medicated per MD order. She was given prn vistaril 25 mg for complaints of anxiety and then tylenol and robaxin for statements of anxiety and leg discomfort, respectively.  R Safety is maintained and POC includes continuing to foster therapeutic relationship already establsiehd PD RN [facility]

## 2012-02-29 NOTE — Progress Notes (Signed)
[facility] Inpatient Family/Significant Other Suicide Prevention Education  Suicide Prevention Education:  Patient Refusal for Family/Significant Other Suicide Prevention Education: The patient Patricia [name] has refused to provide written consent for family/significant other to be provided Family/Significant Other Suicide Prevention Education during admission and/or prior to discharge.  Physician notified.  Lamar Blinks [place] 02/29/2012, 11:11 AM

## 2012-03-01 NOTE — Progress Notes (Signed)
  Patricia [name] is a 36 y.o. female [id number] 1976-03-09  02/27/2012 Principal Problem:  *Cocaine abuse Active Problems:  Schizoaffective disorder, bipolar type  Opiate use   Mental Status: Mood  Is slightly better. Not suicidal/homiciadal or psychotic.   Subjective/Objective: Doesn't go to group- encouraged to stay up after lunch.    Filed Vitals:   03/01/12 0701  BP: 91/56  Pulse: 93  Temp:   Resp:     Lab Results:   BMET    Component Value Date/Time   NA 143 02/27/2012 0300   K 3.0* 02/27/2012 0300   CL 104 02/27/2012 0300   CO2 22 02/27/2012 0300   GLUCOSE 106* 02/27/2012 0300   BUN 10 02/27/2012 0300   CREATININE 0.82 02/27/2012 0300   CALCIUM 9.7 02/27/2012 0300   GFRNONAA >90 02/27/2012 0300   GFRAA >90 02/27/2012 0300    Medications:  Scheduled:     . ciprofloxacin  250 mg Oral BID  . escitalopram  20 mg Oral Daily  . gabapentin  600 mg Oral QID  . iloperidone  2 mg Oral BID  . pantoprazole  40 mg Oral Q1200  . ramelteon  8 mg Oral QHS  . traZODone  150 mg Oral QHS     PRN Meds acetaminophen, dicyclomine, hydrOXYzine, loperamide, magnesium hydroxide, methocarbamol, naproxen, promethazine Plan: no med changes indicated           Continue with current plan of care    Patricia [name],MICKIE D. 03/01/2012

## 2012-03-01 NOTE — Progress Notes (Addendum)
[name] has spent most of the day in her bed.....sleeping. Her mood and affect are brighter today. Yet she remains sad and depressed. She does not attend her groups and is not engaged in her POC...remaining quite focused on her somatic issues. A She is encouraged to complete and return her self inventory , but she chose not to do this.At 1211 and 1701, she received IM doses of phenergan 25 mg, for complaints of vomiting. She is instructed to save vomitus ( if any ) in the future if she vomits again / anymore.She demonstrates poor insight into her disease and her drug addiciton. R Safety is maintaiend and POC icnludes continuing to foster therapeutic relationship already established PD RN [facility]

## 2012-03-01 NOTE — BHH Counselor (Signed)
Adult Comprehensive Assessment  Patient ID: Patricia [name], female   DOB: 1976/03/30, 36 y.o.   MRN: [id number]  Information Source: Information source: Patient  Current Stressors:  Educational / Learning stressors: High school diploma and 2 associate [employer] Employment / Job issues: Unemployed Family Relationships: Pt. reports no problems Surveyor, quantity / Lack of resources (include bankruptcy): Pt. reports no problems Housing / Lack of housing: Pt. is homeless-looking at [employer] Physical health (include injuries & life threatening diseases): Pt. reports no problems Social relationships: Pt. reports no problems in this area (Pt. reports no problems) Substance abuse: Cocaine use- one or twice o year. Pt. denies alchol use Bereavement / Loss: Pt. reports no problems  Living/Environment/Situation:  Living Arrangements: Other (Comment) (Pt. is homeless) Living conditions (as described by patient or guardian): Homeless How long has patient lived in current situation?: Homeless What is atmosphere in current home: Temporary;Chaotic (pt. is homeless)  Family History:  Marital status: Divorced Divorced, when?: 6 years What types of issues is patient dealing with in the relationship?: Did not get along Additional relationship information: No additional information Does patient have children?: Yes How many children?: 3  (2 boys and one girl) How is patient's relationship with their children?: Pt. is close to children- They live with pt,'s mother  Childhood History:  By whom was/is the patient raised?: Mother Additional childhood history information: Pt. reports a gpood childhhod Description of patient's relationship with caregiver when they were a child: Pt. was close to her mother Patient's description of current relationship with people who raised him/her: Pt. is still close to mother- Pt.'s children live with her mother Does patient have siblings?: No Did patient suffer any  verbal/emotional/physical/sexual abuse as a child?: No Did patient suffer from severe childhood neglect?: No Has patient ever been sexually abused/assaulted/raped as an adolescent or adult?: No Was the patient ever a victim of a crime or a disaster?: No Witnessed domestic violence?: No Has patient been effected by domestic violence as an adult?: No  Education:  Highest grade of school patient has completed: Geneticist, molecular and two associate degrees Currently a student?: No Learning disability?: No  Employment/Work Situation:   Employment situation: Unemployed Patient's job has been impacted by current illness: No What is the longest time patient has a held a job?: 8 years Where was the patient employed at that time?: [employer] located in [place] Has patient ever been in the [employer]?: No Has patient ever served in Buyer, retail?: No  Financial Resources:   Surveyor, quantity resources: No income Does patient have a Lawyer or guardian?: No Name of representative payee or guardian: Pt. has no guardian or payee and is responsible for self  Alcohol/Substance Abuse:   What has been your use of drugs/alcohol within the last 12 months?: Pt. reports Cocaine use If attempted suicide, did drugs/alcohol play a role in this?: No Alcohol/Substance Abuse Treatment Hx: Past Tx, Inpatient If yes, describe treatment: At [facility] -Pt. was unable to remeber when Has alcohol/substance abuse ever caused legal problems?: No  Social Support System:   Patient's Community Support System: Good Describe Community Support System: Pt. reports good support system Type of faith/religion: Ephriam Knuckles How does patient's faith help to cope with current illness?: Pt. attends church  Leisure/Recreation:   Leisure and Hobbies: Pt. did not have a response to this question.  Strengths/Needs:   What things does the patient do well?: Pt. had no response to this question In what areas does patient struggle / problems  for  patient: Being homeless and Cocaine use  Discharge Plan:   Does patient have access to transportation?: Yes (Pt.'s mother can pick up at discharge) Will patient be returning to same living situation after discharge?: No Plan for living situation after discharge: Pt. is trying to find [place] [facility] Currently receiving community mental health services: No If no, would patient like referral for services when discharged?: Yes (What county?) Does patient have financial barriers related to discharge medications?: Yes Patient description of barriers related to discharge medications: Pt. reports she can pay for medication but is unemployed and has no income  Summary/Recommendations:   Summary and Recommendations (to be completed by the evaluator): Pt. is a 36 year old female admitted for depression and substance abuse(Cocaine Use).. Pt. is homeless and needs housing. Pt. was given a list of [facility] to Call by casemanger on Friday . Pt. has no doctor or therapistPt. recommendations include: Crisis Stablization, Case Mangement, Group therphy, and Medication Ma.anagement  Twana Wileman, Cecilie Lowers. 03/01/2012

## 2012-03-01 NOTE — Progress Notes (Signed)
[facility] Group Notes:  (Counselor/Nursing/MHT/Case Management/Adjunct)  03/01/2012 4:39 PM  Type of Therapy:  Group Therapy  Participation Level:  Active  Participation Quality:  Appropriate and Attentive  Affect:  Appropriate  Cognitive:  Appropriate  Insight:  Good  Engagement in Group:  Good  Engagement in Therapy:  Good  Modes of Intervention:  Clarification, Education, Socialization and Support  Summary of Progress/Problems: Pt. participated in group discussion on support and what it means to them and how to distinguish healthy and unhealthy supports. Each pt identified at least one support in their life and how that person/group supports them. Pt. Spoke about support meaning to her someone helping her through the tough times Pt. Stated that family and friends are important to her for her support. [name] 03/01/2012, 4:39 PM

## 2012-03-02 DIAGNOSIS — F1994 Other psychoactive substance use, unspecified with psychoactive substance-induced mood disorder: Secondary | ICD-10-CM

## 2012-03-02 DIAGNOSIS — F1110 Opioid abuse, uncomplicated: Secondary | ICD-10-CM

## 2012-03-02 MED ORDER — LORATADINE 10 MG PO TABS
10.0000 mg | ORAL_TABLET | Freq: Every day | ORAL | Status: DC
  Administered 2012-03-02 – 2012-03-04 (×3): 10 mg via ORAL
  Filled 2012-03-02 (×5): qty 1
  Filled 2012-03-02: qty 14

## 2012-03-02 MED ORDER — CHLORPROMAZINE HCL 25 MG PO TABS: 25.0000 mg | ORAL_TABLET | Freq: Three times a day (TID) | ORAL | Status: DC

## 2012-03-02 MED ORDER — CHLORPROMAZINE HCL 10 MG PO TABS
5.0000 mg | ORAL_TABLET | Freq: Three times a day (TID) | ORAL | Status: DC
  Administered 2012-03-02 – 2012-03-04 (×5): 5 mg via ORAL
  Filled 2012-03-02 (×5): qty 1
  Filled 2012-03-02: qty 42
  Filled 2012-03-02 (×3): qty 1
  Filled 2012-03-02: qty 42
  Filled 2012-03-02: qty 1
  Filled 2012-03-02: qty 42

## 2012-03-02 NOTE — Progress Notes (Signed)
Date: 03/02/2012        Time: 1145       Group Topic/Focus: Patient invited to participate in animal assisted therapy. Pets as a coping skill and responsibility were discussed.   Participation Level: Active  Participation Quality: Appropriate and Attentive  Affect: Appropriate  Cognitive: Appropriate and Oriented   Additional Comments: None    

## 2012-03-02 NOTE — Progress Notes (Signed)
D - Pt up and ambulating some this evening and attended group. Pt continues to c/o anxiety and withdrawals, pt appears less anxious this evening, no tremors noted. Pt denies any nausea or vomiting tonight, reminded pt to save vomitus for the staff to view, pt agreed. Pt rated her anxiety level as a 3 and states "I still feel jittery but I've had a good day and I want to feel better"  A - Pt continues to receive vistaril for anxiety and tylenol and robaxin for pain. Encourage pt to attend groups and interact with other patients.  R - Continue current POC, encourage pt to be more active in groups and self assessment. Safety maintained.

## 2012-03-02 NOTE — B[facility] Counselor (Addendum)
Group Therapy Note Overcoming Obstacle to Wellness  Date:  03/02/2012 Time:  11:00  Group Topic/Focus:  Dimensions of Wellness:   The focus of this group is to introduce the topic of wellness and discuss the role each dimension of wellness plays in total health.  Participation Level:  Minimal  Participation Quality:  Attentive  Affect:  Appropriate  Cognitive:  Appropriate  Insight:  Did not talk much.   Engagement in Group:  Limited  Additional Comments:  Patricia [name] was quiet throughout group but reported that she would like to work on her social and physical wellness.   Huq, Patricia [name]. 03/02/2012, 12:06 PM     [facility] Group Notes: (Counselor/Nursing/MHT/Case Management/Adjunct) 03/02/2012   @1 :15pm  Rules for Recovery  Type of Therapy:  Group Therapy  Participation Level:  None  Participation Quality: None   Affect:  Blunted  Cognitive:  Appropriate  Insight:  None  Engagement in Group: None  Engagement in Therapy:  None  Modes of Intervention:  Support and Exploration  Summary of Progress/Problems: Patricia [name] did not appear attentive. She sat at the back of the room with her head in her hands.  Billie Lade 03/02/2012 2:54 PM

## 2012-03-02 NOTE — Progress Notes (Signed)
D slept well last nite, appetite is improving, going to Dr for meals,energy level is low and ability to pay attention is improving, depressed 3/10 and hopeless 2/10 today, denies Si or HI, still getting sick on stomach prior to meals, vomitus not observed, Phenergan dc/d today. Felt better after lunch and dinner with prn Robaxin and Tylenol, states this helps her stomach. A q55min safety checks continue and support offered Safety maintained

## 2012-03-02 NOTE — Progress Notes (Signed)
[facility] MD Progress Note  03/02/2012 6:47 PM  Diagnosis:   Axis I: Schizoaffective Disorder, Substance Induced Mood Disorder and Cocaine and Opiate Dependence Axis II: Deferred Axis III:  Past Medical History  Diagnosis Date  . Depression   . Schizoaffective disorder, bipolar type     ADL's:  Intact  Sleep: Good  Appetite:  Poor, appetite is suppressed by her nausea  Suicidal Ideation:  Pt denies any suicidal thoughts. Homicidal Ideation:  Denies adamantly any homicidal thoughts.  Mental Status Examination/Evaluation: Objective:  Appearance: Casual  Eye Contact::  Good  Speech:  Clear and Coherent  Volume:  Normal  Mood:  Anxious and Dysphoric  Affect:  Congruent  Thought Process:  Coherent  Orientation:  Full  Thought Content:  WDL  Suicidal Thoughts:  No  Homicidal Thoughts:  No  Memory:  Immediate;   Fair  Judgement:  Impaired  Insight:  Lacking  Psychomotor Activity:  Normal  Concentration:  Fair  Recall:  Fair  Akathisia:  No  Handed:  Right  AIMS (if indicated):     Assets:  Communication Skills Desire for Improvement  Sleep:  Number of Hours: 6.25     ROS: Neuro: no headaches, ataxia, weakness  GI: no diarrhea/cramps/constipation, persistent nausea and vomiting  MS: no weakness, muscle cramps, aches. Back soreness is helped with the Tylenol and Robaxin.   Vital Signs:Blood pressure 96/58, pulse 102, temperature 99.9 F (37.7 C), temperature source Oral, resp. rate 16, height 5\' 8"  (1.727 m), weight 99.791 kg (220 lb), last menstrual period 01/26/2012. Current Medications: Current Facility-Administered Medications  Medication Dose Route Frequency Provider Last Rate Last Dose  . acetaminophen (TYLENOL) tablet 650 mg  650 mg Oral Q6H PRN Mike Craze, MD   650 mg at 03/02/12 1820  . chlorproMAZINE (THORAZINE) tablet 5 mg  5 mg Oral TID Mike Craze, MD      . ciprofloxacin (CIPRO) tablet 250 mg  250 mg Oral BID Sanjuana Kava, NP   250 mg at  03/02/12 1478  . dicyclomine (BENTYL) tablet 20 mg  20 mg Oral Q6H PRN Mike Craze, MD      . escitalopram (LEXAPRO) tablet 20 mg  20 mg Oral Daily Mike Craze, MD   20 mg at 03/02/12 0837  . gabapentin (NEURONTIN) capsule 600 mg  600 mg Oral QID Mike Craze, MD   600 mg at 03/02/12 1709  . hydrOXYzine (ATARAX/VISTARIL) tablet 25 mg  25 mg Oral Q6H PRN Mike Craze, MD   25 mg at 03/02/12 0910  . iloperidone (FANAPT) tablet 2 mg  2 mg Oral BID Mike Craze, MD   2 mg at 03/02/12 1709  . loperamide (IMODIUM) capsule 2-4 mg  2-4 mg Oral PRN Mike Craze, MD      . loratadine (CLARITIN) tablet 10 mg  10 mg Oral Daily Mike Craze, MD      . magnesium hydroxide (MILK OF MAGNESIA) suspension 30 mL  30 mL Oral Daily PRN Mike Craze, MD      . methocarbamol (ROBAXIN) tablet 500 mg  500 mg Oral Q8H PRN Mike Craze, MD   500 mg at 03/02/12 1821  . naproxen (NAPROSYN) tablet 500 mg  500 mg Oral BID PRN Mike Craze, MD   500 mg at 03/02/12 1408  . pantoprazole (PROTONIX) EC tablet 40 mg  40 mg Oral Q1200 Sanjuana Kava, NP   40 mg at 03/02/12 1202  .  ramelteon (ROZEREM) tablet 8 mg  8 mg Oral QHS Mike Craze, MD   8 mg at 03/01/12 2104  . traZODone (DESYREL) tablet 150 mg  150 mg Oral QHS Mike Craze, MD   150 mg at 03/01/12 2104  . DISCONTD: chlorproMAZINE (THORAZINE) tablet 25 mg  25 mg Oral TID Mike Craze, MD      . DISCONTD: promethazine (PHENERGAN) injection 12.5-25 mg  12.5-25 mg Intramuscular Q6H PRN Mike Craze, MD   25 mg at 03/01/12 1701    Lab Results:  No results found for this or any previous visit (from the past 48 hour(s)).  Physical Findings: AIMS:  , ,  ,  ,    CIWA:  CIWA-Ar Total: 3  COWS:     Treatment Plan Summary: Daily contact with patient to assess and evaluate symptoms and progress in treatment Medication management No signs/symptoms of withdrawal and mood/anxiety less than 3/10 where 1 is the best and 10 is the worst  Plan: Continue  current meds with the addition of extremely low dose of thorazine and Claritin. She notes a lot of anxiety.  She has failed on Zyprexa, Risperdal, Haldol, and Fanapt by themselves. Will try Thorazine.   Dan Humphreys, Jenina Moening 03/02/2012, 6:47 PM

## 2012-03-02 NOTE — Tx Team (Signed)
Interdisciplinary Treatment Plan Update (Adult)  Date:  03/02/2012  Time Reviewed:  10:49 AM   Progress in Treatment: Attending groups: Yes Participating in groups:  Yes Taking medication as prescribed: Yes Tolerating medication:  Yes Family/Significant other contact made:  No, pt refused Patient understands diagnosis:  Yes Discussing patient identified problems/goals with staff:  Yes Medical problems stabilized or resolved:  Yes Denies suicidal/homicidal ideation: Yes Issues/concerns per patient self-inventory:  None identified Other: N/A  New problem(s) identified: None Identified  Reason for Continuation of Hospitalization: Anxiety Depression Medication stabilization  Interventions implemented related to continuation of hospitalization: mood stabilization, medication monitoring and adjustment, group therapy and psycho education, safety checks q 15 mins  Additional comments: N/A  Estimated length of stay: 1-2 days  Discharge Plan: Pt will follow up at [facility] for medication management and therapy.  Pt is supposed to call [employer] today.    New goal(s): N/A  Review of initial/current patient goals per problem list:    1.  Goal(s): Reduce depressive symptoms  Met:  Yes  Target date: by discharge  As evidenced by: Reducing depression from a 10 to a 3 as reported by pt. Pt denies depression today.  2.  Goal (s): Reduce/Eliminate suicidal ideation  Met:  Yes  Target date: by discharge  As evidenced by: pt reporting no SI.    3.  Goal(s): Reduce anxiety symptoms  Met:  No  Target date: by discharge  As evidenced by: Reduce anxiety from a 10 to a 3 as reported by pt. Pt rates at a 5 today.    Attendees: Patient:     Family:     Physician:  Orson Aloe, MD  03/02/2012  10:49 AM   Nursing:   Alease Frame, RN 03/02/2012 10:51 AM   Case Manager:  Reyes Ivan, LCSWA 03/02/2012  10:49 AM   Counselor:  Angus Palms, LCSW 03/02/2012  10:49 AM   Other:   Juline Patch, LCSW 03/02/2012  10:49 AM   Other:   03/02/2012  10:49 AM   Other:     Other:      Scribe for Treatment Team:   Carmina Miller, 03/02/2012 , 10:49 AM

## 2012-03-02 NOTE — Progress Notes (Signed)
Pt attended discharge planning group and actively participated.  Pt presents with flat affect and depressed mood.  Pt reports no depression and rates anxiety at a 5 today.  Pt denies SI.  Pt was given a list of [facility] on Friday by SW and told she should call over the weekend to secure an interview.  Pt states that she was confused and unsure of what to do.  SW explained that pt should be calling, seeing if there is an opening and either doing the interview over the phone or scheduling an interview.  Pt states that she will do this today.  Pt will follow up with [facility] in [facility] for medication management and therapy.  No further needs voiced by pt at this time.  Safety planning and suicide prevention discussed.     Reyes Ivan, LCSWA 03/02/2012  11:41 AM

## 2012-03-03 MED ORDER — TRAZODONE HCL 100 MG PO TABS
200.0000 mg | ORAL_TABLET | Freq: Every day | ORAL | Status: DC
  Administered 2012-03-03: 200 mg via ORAL
  Filled 2012-03-03 (×3): qty 2
  Filled 2012-03-03: qty 28

## 2012-03-03 NOTE — Progress Notes (Signed)
Patient up and visible on the unit today.  Attending groups and interacting well with staff and peers.  Became very upset by some information that was being discussed in group today.  She was told it was okay to come out of group, but opted to go back in after taking a time out.  She has received Tylenol and Robaxin for pain twice this shift and Naproxen once with good effect.  She has been pleasant and cooperative.  Tolerating medications and meals.  Patient states that she was accepted at a facility in [place] today, but cannot report there until Tuesday of next week.  Still plans to discharge tomorrow morning as planned.

## 2012-03-03 NOTE — BHH Suicide Risk Assessment (Signed)
Suicide Risk Assessment  Discharge Assessment      Demographic factors: Divorced or widowed;Caucasian;Low socioeconomic status;Living alone;Unemployed  Current Mental Status Per Physician: Patient seen and evaluated for Dr. Dan Humphreys. Chart reviewed. Patient stated that her mood was "good".  She stopped all her meds prior to admission and stated that she is now doing well on the current regimen. Her affect was mood congruent and euthymic. She denied any current thoughts of self injurious behavior, suicidal ideation or homicidal ideation. There were no auditory or visual hallucinations, paranoia, delusional thought processes, or mania noted.  Thought process was linear and goal directed.  No psychomotor agitation or retardation was noted.  Mild anxiety noted about discharge, is ok with tomorrow or Thursday. Speech was normal rate, tone and volume. Eye contact was good. Judgment and insight are fair.  Patient has been up and engaged on the unit.  No acute safety concerns reported from team.   Loss Factors: Loss of significant relationship;Financial problems / change in socioeconomic status; s/p recent relapse on cocaine  Historical Factors: Prior suicide attempts;Family history of mental illness or substance abuse; had 3 yr sobriety period before relapse  Risk Reduction Factors: Responsible for children under 25 years of age;Sense of responsibility to family;Religious beliefs about death;Positive social support  Discharge Diagnoses:   AXIS I:  Schizoaffective Disorder, per Hx; Cocaine and Opiate Dependence AXIS II:  Deferred AXIS III:   Past Medical History  Diagnosis Date  . Depression   . Schizoaffective disorder, bipolar type    AXIS IV: Moderate AXIS V: 50  Cognitive Features That Contribute To Risk: limited insight.  Suicide Risk: Pt viewed as a chronic increased risk of harm to self in light of her past hx and risk factors.  No acute safety concerns on the unit.  Pt contracting for  safety and stable for discharge in am by Dr. Dan Humphreys.  Plan Of Care/Follow-up recommendations: Pt seen and evaluated. Chart reviewed.  Pt stable for and requesting discharge tomorrow or Thursday. Further requested d/c of Rozerem and increase in Trazodone to decrease polypharmacy for sleep. Pt contracting for safety and does not currently meet [place] involuntary commitment criteria for continued hospitalization against her will.  Mental health treatment, medication management and continued sobriety will mitigate against the increased risk of harm to self and/or others.  Discussed the importance of recovery further with pt, as well as, tools to move forward in a healthy & safe manner.  Pt agreeable with the plan.  Discussed with the team.  Please see orders, follow up appointments per AVS and full discharge summary to be completed by primary team.  Pt has been accepted into an [facility] next week and can stay with a friend until then with pick up by parents from hospital.  Recommend follow up with NA.  Diet: Regular.  Activity: As tolerated.   Patricia [name] 03/03/2012, 4:24 PM

## 2012-03-03 NOTE — Progress Notes (Signed)
Pt has gained a sense of appetite this evening and was given a sub tray per request. Pt reports vomiting after dinner. RN from previous shift reported this as well ( however reports that pt flushed evidence). Pt reports some anxiety. Pt was started on thorazine 5 mg this evening.Pt aware of indication. No adverse effects reported to this writer by the pt. Pt is denying SI at this time. Pt has been calm and cooperative this evening. Pt observed interacting approprietly within the milieu. Continued support and availability as needed has been extended to this pt. Pt safety remains with q62min checks.

## 2012-03-03 NOTE — Progress Notes (Signed)
Pt attended discharge planning group and actively participated.  Pt presents with calm mood and affect.  Pt rates depression and anxiety at a 5 today.  Pt denies SI.  Pt states that she called some of the [facility] last night and was told they were all full.  Pt requested an updated list of the homes so she can continue to call today.  Pt will follow up at [facility] for medication management and therapy. No further needs voiced by pt at this time.    Reyes Ivan, LCSWA 03/03/2012  10:35 AM

## 2012-03-03 NOTE — Progress Notes (Signed)
[facility] Group Notes: (Counselor/Nursing/MHT/Case Management/Adjunct) 03/03/2012   @ 11:00 Feelings About Diagnosis  Type of Therapy:  Group Therapy  Participation Level:  Minimal  Participation Quality:  Drowsy   Affect:  Appropriate  Cognitive:  Appropriate  Insight:  None  Engagement in Group: Limited  Engagement in Therapy: Minimal   Modes of Intervention:  Support and Exploration  Summary of Progress/Problems: Patricia [name] was very attentive to group and seemed interested, but she did not share her personal experiences. She declined to speak when called on, stating that she is feeling very tired today. However, she showed counselor through facial expressions that she was alarmed at some of the opposing opinions offered by other group members.   Billie Lade 03/03/2012  1:18 PM

## 2012-03-03 NOTE — Progress Notes (Signed)
Grief and Loss Group  Co-facilitated grief and loss group w/ Corliss Marcus, MA, MEd, LPCA, NCC for pt's on [address]. Group focused on types of loss (re: death/dying, relationships, divorce, jobs, mental health, children) and grief reactions (re: depression, anger, feeling cheated, feeling unsettled). Group was open and initially supportive, group had difficulty w/ group member who expressed anger toward others in his own life.  Pt was attentive throughout group, but did not speak/participate. Pt told Clinical research associate during a group activity that she was feeling "out of sorts" d/t changes in medication, was feeling tired.  Patricia [name] B MS, LPCA, NCC

## 2012-03-04 MED ORDER — GABAPENTIN 300 MG PO CAPS: 600.0000 mg | ORAL_CAPSULE | Freq: Four times a day (QID) | ORAL | Status: DC

## 2012-03-04 MED ORDER — TRAZODONE HCL 100 MG PO TABS: 200.0000 mg | ORAL_TABLET | Freq: Every day | ORAL | Status: DC

## 2012-03-04 MED ORDER — LORATADINE 10 MG PO TABS: 10.0000 mg | ORAL_TABLET | Freq: Every day | ORAL | Status: DC

## 2012-03-04 MED ORDER — ILOPERIDONE 2 MG PO TABS: 2.0000 mg | ORAL_TABLET | Freq: Two times a day (BID) | ORAL | Status: DC

## 2012-03-04 MED ORDER — ESCITALOPRAM OXALATE 20 MG PO TABS: 20.0000 mg | ORAL_TABLET | Freq: Every day | ORAL | Status: DC

## 2012-03-04 MED ORDER — CHLORPROMAZINE HCL 10 MG PO TABS: 5.0000 mg | ORAL_TABLET | Freq: Three times a day (TID) | ORAL | Status: DC

## 2012-03-04 MED ORDER — OMEPRAZOLE 20 MG PO CPDR: 20.0000 mg | DELAYED_RELEASE_CAPSULE | Freq: Every day | ORAL | Status: DC

## 2012-03-04 NOTE — Progress Notes (Signed)
D) Patient pleasant and cooperative upon my assessment. Patient reports she slept "fair" and her appetite is "improving." Patient rates depression ans hopelessness as 1/10. Patient denies SI/HI, denies A/V hallucinations. A) Patient given support and encouragement. R) Patient states she is "ready to go home today." Patient describes her plan after discharge is to "take meds." Patient remains safe on unit with Q15 minute checks for safety. Will continue to monitor.

## 2012-03-04 NOTE — Progress Notes (Signed)
Patient ID: Patricia [name], female   DOB: 07-13-1976, 36 y.o.   MRN: [id number] Contrary to report, pt states she requested to stay at [facility] an extra day. But states she was told that she's to be discharged tomorrow.  Pt stated she's ready for discharge, and plans to go to [facility] on Thurs.

## 2012-03-04 NOTE — Tx Team (Signed)
Interdisciplinary Treatment Plan Update (Adult)  Date:  03/04/2012  Time Reviewed:  10:10 AM   Progress in Treatment: Attending groups: Yes Participating in groups:  Yes Taking medication as prescribed: Yes Tolerating medication:  Yes Family/Significant other contact made:  Yes Patient understands diagnosis:  Yes Discussing patient identified problems/goals with staff:  Yes Medical problems stabilized or resolved:  Yes Denies suicidal/homicidal ideation: Yes Issues/concerns per patient self-inventory:  None identified Other: N/A  New problem(s) identified: None Identified  Reason for Continuation of Hospitalization: Stable to d/c  Interventions implemented related to continuation of hospitalization: Stable to d/c  Additional comments: N/A  Estimated length of stay: D/C today  Discharge Plan: Pt will follow up with [facility] for med management and therapy.   New goal(s): N/A  Review of initial/current patient goals per problem list:    1.  Goal(s): Reduce depressive symptoms  Met:  Yes  Target date: by discharge  As evidenced by: Reducing depression from a 10 to a 3 as reported by pt. Pt denies depression today.   2.  Goal (s): Reduce/Eliminate suicidal ideation  Met:  Yes  Target date: by discharge  As evidenced by: pt reporting no SI.    3.  Goal(s): Reduce anxiety symptoms  Met:  Yes  Target date: by discharge  As evidenced by: Reduce anxiety from a 10 to a 3 as reported by pt. Pt denies anxiety today.    Attendees: Patient:  Patricia [name] 03/04/2012 10:11 AM   Family:     Physician:  Orson Aloe, MD  03/04/2012  10:10 AM   Nursing:   Abbie Sons, RN 03/04/2012 10:12 AM   Case Manager:  Reyes Ivan, LCSWA 03/04/2012  10:10 AM   Counselor:  Angus Palms, LCSW 03/04/2012  10:10 AM   Other:  Juline Patch, LCSW 03/04/2012  10:10 AM   Other:  Clarice Pole, LCASA 03/04/2012  10:10 AM   Other:     Other:      Scribe for Treatment Team:     Carmina Miller, 03/04/2012 , 10:10 AM

## 2012-03-04 NOTE — Progress Notes (Signed)
[facility] MD Progress Note  03/04/2012 11:27 PM  Diagnosis:   Axis I: Schizoaffective Disorder, Substance Induced Mood Disorder and Cocaine and Opiate Dependence Axis II: Deferred Axis III:  Past Medical History  Diagnosis Date  . Depression   . Schizoaffective disorder, bipolar type     ADL's:  Intact  Sleep: Good  Appetite:  Poor, appetite is suppressed by her nausea  Suicidal Ideation:  Pt denies any suicidal thoughts. Homicidal Ideation:  Denies adamantly any homicidal thoughts.  Mental Status Examination/Evaluation: Objective:  Appearance: Casual  Eye Contact::  Good  Speech:  Clear and Coherent  Volume:  Normal  Mood:  Anxious  Affect:  Congruent  Thought Process:  Coherent  Orientation:  Full  Thought Content:  WDL  Suicidal Thoughts:  No  Homicidal Thoughts:  No  Memory:  Immediate;   Fair  Judgement:  Fair  Insight:  Fair  Psychomotor Activity:  Normal  Concentration:  Good  Recall:  Good  Akathisia:  No  Handed:  Right  AIMS (if indicated):     Assets:  Communication Skills Desire for Improvement  Sleep:  Number of Hours: 6.75     ROS: Neuro: no headaches, ataxia, weakness  GI: no diarrhea/cramps/constipation, persistent nausea and vomiting  MS: no weakness, muscle cramps, aches. Back soreness is helped with the Tylenol and Robaxin.   Vital Signs:Blood pressure 96/69, pulse 105, temperature 97 F (36.1 C), temperature source Oral, resp. rate 16, height 5\' 8"  (1.727 m), weight 99.791 kg (220 lb), last menstrual period 01/26/2012. Current Medications: Current Facility-Administered Medications  Medication Dose Route Frequency Provider Last Rate Last Dose  . DISCONTD: acetaminophen (TYLENOL) tablet 650 mg  650 mg Oral Q6H PRN Mike Craze, MD   650 mg at 03/04/12 0639  . DISCONTD: chlorproMAZINE (THORAZINE) tablet 5 mg  5 mg Oral TID Mike Craze, MD   5 mg at 03/04/12 0811  . DISCONTD: escitalopram (LEXAPRO) tablet 20 mg  20 mg Oral Daily Mike Craze, MD   20 mg at 03/04/12 0811  . DISCONTD: gabapentin (NEURONTIN) capsule 600 mg  600 mg Oral QID Mike Craze, MD   600 mg at 03/04/12 0811  . DISCONTD: iloperidone (FANAPT) tablet 2 mg  2 mg Oral BID Mike Craze, MD   2 mg at 03/04/12 0811  . DISCONTD: loratadine (CLARITIN) tablet 10 mg  10 mg Oral Daily Mike Craze, MD   10 mg at 03/04/12 0811  . DISCONTD: magnesium hydroxide (MILK OF MAGNESIA) suspension 30 mL  30 mL Oral Daily PRN Mike Craze, MD      . DISCONTD: pantoprazole (PROTONIX) EC tablet 40 mg  40 mg Oral Q1200 Sanjuana Kava, NP   40 mg at 03/03/12 1200  . DISCONTD: traZODone (DESYREL) tablet 200 mg  200 mg Oral QHS Alyson Kuroski-Mazzei, DO   200 mg at 03/03/12 2133   Current Outpatient Prescriptions  Medication Sig Dispense Refill  . ibuprofen (ADVIL,MOTRIN) 800 MG tablet Take 800 mg by mouth 3 (three) times daily. Pain.      Marland Kitchen DISCONTD: gabapentin (NEURONTIN) 400 MG capsule Take 400 mg by mouth 2 (two) times daily.      Marland Kitchen DISCONTD: Iloperidone (FANAPT) 2 MG TABS Take 2 mg by mouth 2 (two) times daily.      Marland Kitchen DISCONTD: omeprazole (PRILOSEC) 20 MG capsule Take 20 mg by mouth daily.      . chlorproMAZINE (THORAZINE) 10 MG tablet Take 0.5-1 tablets (5-10  mg total) by mouth 3 (three) times daily. For anxiety  90 tablet  0  . escitalopram (LEXAPRO) 20 MG tablet Take 1 tablet (20 mg total) by mouth daily. For depression.  30 tablet  0  . gabapentin (NEURONTIN) 300 MG capsule Take 2 capsules (600 mg total) by mouth 4 (four) times daily. For anxiety  240 capsule  0  . Iloperidone (FANAPT) 2 MG TABS Take 1 tablet (2 mg total) by mouth 2 (two) times daily. For mood control and psychosis.  60 tablet  0  . loratadine (CLARITIN) 10 MG tablet Take 1 tablet (10 mg total) by mouth daily. For allergy relief  30 tablet  0  . omeprazole (PRILOSEC) 20 MG capsule Take 1 capsule (20 mg total) by mouth daily. For control of stomach acid secretion and helps GERD.  30 capsule  0  .  traZODone (DESYREL) 100 MG tablet Take 2 tablets (200 mg total) by mouth at bedtime. For insomnia.  60 tablet  0  . DISCONTD: Iloperidone (FANAPT) 2 MG TABS Take 1 tablet (2 mg total) by mouth 2 (two) times daily. For mood control.  60 tablet  0    Lab Results:  No results found for this or any previous visit (from the past 48 hour(s)).  Physical Findings: AIMS:  , ,  ,  ,    CIWA:  CIWA-Ar Total: 1  COWS:     Treatment Plan Summary: Daily contact with patient to assess and evaluate symptoms and progress in treatment Medication management No signs/symptoms of withdrawal and mood/anxiety less than 3/10 where 1 is the best and 10 is the worst  Plan: Plans to stay on meds and go to AA and [facility] for her substance abuse work.  Patricia [name] 03/04/2012, 11:27 PM

## 2012-03-04 NOTE — Progress Notes (Signed)
Pt is in bed resting with eyes. Respirations even and unlabored. Pt appears to be in no signs of distress at this time. Pt safety remains with q15min checks.  

## 2012-03-04 NOTE — Discharge Summary (Signed)
Physician Discharge Summary Note  Patient:  Patricia [name] is an 36 y.o., female MRN:  [id number] DOB:  01/14/1976 Patient phone:  [phone] (home)  Patient address:   [address] [place] [address],   Date of Admission:  02/27/2012 Date of Discharge: 03/04/2012  Discharge Diagnoses: Principal Problem:  *Cocaine abuse Active Problems:  Schizoaffective disorder, bipolar type  Opiate use  Axis Diagnosis:  AXIS I:  Schizoaffective Disorder, per Hx; Cocaine and Opiate Dependence AXIS II:  Deferred AXIS III:   Past Medical History     .  Depression     .  Schizoaffective disorder, bipolar type     AXIS IV: Moderate AXIS V: 50  Level of Care:  OP  Hospital Course:   This is a 36 year old Caucasian female, admitted to [facility] from the [facility] ED with complaints of cocaine induced chest pains and SOB. Patient reports, "I have not been taking my medicines x 4 days. I thought I would be okay without them. However, the result was that I became very agitated instead. I was suppose to be on Neurontin 400 mg bid, Lexapro 20 mg daily, Trazodone 150 mg Q bedtime, Ambien 5 mg Q bedtime and Valium 2 mg bid. I have Bipolar disorder and Schizophrenia. And because I have not taken my medicines x 4 days, I started hearing voices again a couple of days ago. When I stopped using my medicines, I was doing cocaine instead and during the time that I was doing cocaine, I started to have chest pains and shortness of breath. That was why I was taken to the ED. This is my 2 time of being in the hospital for my mental illness. I had also been to [facility] and a hospital in [place]. All these happened about 1 year ago. I have an ovarian Cyst. I take Percocet for that, and valium 2 mg bid for my anxiety"  While a patient in this hospital, [name] received medication management for her schizoaffective disorder and her abdominal pain. They were ordered and received Fanapt for her  mood and psychosis, Motrin and Neurontin for her pain, Thorazine for anxiety, Lexapro for depression, and Trazodone for insomnia. They were also enrolled in group counseling sessions and activities in which they participated actively.   Patient attended treatment team meeting this am and met with treatment team members. Pt symptoms, treatment plan and response to treatment discussed. [name] endorsed that their symptoms have improved. Pt also stated that they are stable for discharge. In other to maintain their mood, psychosis, anxiety, insomnia, and pain, they will continue medications and psychiatric care on outpatient basis. They will follow-up at [facility] on 6/24 at 1000. Upon discharge, patient adamantly denies suicidal, homicidal ideations, auditory, visual hallucinations and or delusional thinking. They left [facility] with all personal belongings in no apparent distress.  Consults:  None  Significant Diagnostic Studies:  labs: potassium low at 3.0  Discharge Vitals:   Blood pressure 96/69, pulse 105, temperature 97 F (36.1 C), temperature source Oral, resp. rate 16, height 5\' 8"  (1.727 m), weight 99.791 kg (220 lb), last menstrual period 01/26/2012.  Mental Status Exam: See Mental Status Examination and Suicide Risk Assessment completed by Attending Physician prior to discharge.  Discharge destination:  Home  Is patient on multiple antipsychotic therapies at discharge:  Yes, see below Has Patient had three or more failed trials of antipsychotic monotherapy by history:  Yes she has failed on separate trials  of Zyprexa, Risperdal, Geodon, Haldol, and now Fanapt and has not had relief of her psychosis, mood dyscontrol, and her anxiety.  The Combo of Fanept with Thorazine has worked well for her and a taper IS NOT INDICATED. Recommended Plan for Multiple Antipsychotic Therapies: Continue both very effective medications where each separately has not worked.  Discharge Orders    Future  Appointments: Provider: Department: Dept Phone: Center:   03/16/2012 4:15 PM Catalina Antigua, MD [facility] [phone] [facility]     Medication List  As of 03/04/2012  9:23 AM   STOP taking these medications         diazepam 5 MG tablet      oxyCODONE-acetaminophen 5-325 MG per tablet      zolpidem 10 MG tablet         TAKE these medications      Indication    chlorproMAZINE 10 MG tablet   Commonly known as: THORAZINE   Take 0.5-1 tablets (5-10 mg total) by mouth 3 (three) times daily. For anxiety       escitalopram 20 MG tablet   Commonly known as: LEXAPRO   Take 1 tablet (20 mg total) by mouth daily. For depression.       gabapentin 300 MG capsule   Commonly known as: NEURONTIN   Take 2 capsules (600 mg total) by mouth 4 (four) times daily. For anxiety       ibuprofen 800 MG tablet   Commonly known as: ADVIL,MOTRIN   Take 800 mg by mouth 3 (three) times daily. Pain.       Iloperidone 2 MG Tabs   Take 1 tablet (2 mg total) by mouth 2 (two) times daily. For mood control.       loratadine 10 MG tablet   Commonly known as: CLARITIN   Take 1 tablet (10 mg total) by mouth daily. For allergy relief       omeprazole 20 MG capsule   Commonly known as: PRILOSEC   Take 1 capsule (20 mg total) by mouth daily. For control of stomach acid secretion and helps GERD.       traZODone 100 MG tablet   Commonly known as: DESYREL   Take 2 tablets (200 mg total) by mouth at bedtime. For insomnia.            Follow-up Information    Follow up with [facility]. (DO NOT D/C W/O APPT HERE!)    Contact information:   [address] [place], [place] [address] [phone]        Follow-up recommendations:   Activities: Resume typical activities Diet: Resume typical diet Other: Follow up with outpatient provider and report any side effects to out patient prescriber.  SignedDan Humphreys, Kalene Cutler 03/04/2012, 9:23 AM

## 2012-03-04 NOTE — Progress Notes (Signed)
Patient verbalizes understanding of discharge instructions, medications and follow up care. Patient acknowledges receipt of all belongings from [facility] locker. Patient discharged home, Escorted out by staff, transported by friend.

## 2012-03-04 NOTE — Discharge Instructions (Signed)
Attend 90 meetings in 90 days. Get trusted sponsor from the advise of others or from whomever in meetings seems to make sense, has a proven track record, will hold you responsible for your sobriety, and both expects and insists on total abstinence.  Work the steps HONESTLY with the trusted sponsor. Get obsessed with your recovery by often reminding yourself of how DEADLY this dredged horrible disease of addiction JUST IS. Focus the first month on speaker meetings where you specifically look at how your life has been wrecked by drugs/alcohol and how your life has been similar to that of the speakers.  DOUBLE WINNER!! Strongly consider attending at least 6 [facility] Meetings to help you learn about how your helping others to the exclusion of helping yourself is actually hurting yourself and is actually an addiction to fixing others and that you need to work the 12 Step to Happiness through the [facility] Tradition. [facility] Family Groups could be helpful with how to deal with substance abusing family and friends. Or your own issues of being in victim role.  There are only 40 [facility] Family Group meetings a week here in [place].  Online are current listing of those meetings @ [email or web address]  There are [facility] Phone meetings.  Search on line and there you can learn the format and can access the schedule for yourself.  They ask you to temproarily block call waiting by starting with *70 then their number is [phone]   Anti whatever Alphabet (Where whatever stands for depression, anxiety, pain or BS in your life.)  A lternate between completely different solutions  B ehold beauty  C ommune with nature  D isplay affection thru hugs, words, understanding or Dance to new/different music  E at healthy food (like Fish Oil)  F eed wildlife  G o fishing  H ike in the woods or H unt down someone who has successfully met similar chalanges  I nspire someone else  J og or J ump  into a new hobby  K eep a diary of your successes  L isten to soothing music or L augh at yourself  M ake music/ art/ poetry/ crafts  N otice something different about yourself, others, how others interact/ respond to each other, nature  O utside of yourself and typical way of doing/ thinking  P ick flowers  R andom acts of kindness without being discovered  S pend time on yourself/ Skin for beauty sake nails, teeth, hair, Soak in tub  T alk to a friend  U se grandma's ideas on how to handle things  V ary your routine  W alk  X tend your comfort zone where other's have invited you to go  Y oga/ other forms of meditation  Z ip up and go outside (or go outside of yourself)  

## 2012-03-04 NOTE — Progress Notes (Signed)
[facility] Case Management Discharge Plan:  Will you be returning to the same living situation after discharge: No. Pt set up to reside at an [facility] in [place] At discharge, do you have transportation home?:Yes,  moom to transport pt Do you have the ability to pay for your medications:Yes,  provided samples and access to meds  Release of information consent forms completed and in the chart;  Patient's signature needed at discharge.  Patient to Follow up at:  Follow-up Information    Follow up with [facility] on 03/09/2012. (Appointment scheduled at 10:00 am with Gennie)    Contact information:   [address] [place], [place] [address] [phone]         Patient denies SI/HI:   Yes,  denies SI/HI    Safety Planning and Suicide Prevention discussed:  Yes,  discussed with pt today  Barrier to discharge identified:No.  Summary and Recommendations: Pt attended discharge planning group and actively participated.  Pt presents with calm mood and affect.  Pt denies having depression, anxiety and SI today.  Pt reports feeling stable to d/c today.  Pt arranged a phone interview with an [employer] in [place] yesterday and was already accepted.  Pt is scheduled to move into it tomorrow morning.  Pt states that her mom will transport her there.  Pt requesting to continue to follow up at [facility] in [facility].  No recommendations from SW.  No further needs voiced by pt.  Pt stable to discharge.     Carmina Miller 03/04/2012, 9:48 AM

## 2012-03-06 NOTE — Progress Notes (Signed)
Patient Discharge Instructions:  After Visit Summary (AVS):   Faxed to:  03/06/2012 Psychiatric Admission Assessment Note:   Faxed to:  03/06/2012 Suicide Risk Assessment - Discharge Assessment:   Faxed to:  03/06/2012 Faxed/Sent to the Next Level Care provider:  03/06/2012  Faxed to [facility] @ [phone]  Wandra Scot, 03/06/2012, 4:11 PM

## 2012-03-16 ENCOUNTER — Ambulatory Visit (INDEPENDENT_AMBULATORY_CARE_PROVIDER_SITE_OTHER): Payer: Self-pay | Admitting: Obstetrics and Gynecology

## 2012-03-16 ENCOUNTER — Telehealth: Payer: Self-pay | Admitting: Medical

## 2012-03-16 DIAGNOSIS — N83209 Unspecified ovarian cyst, unspecified side: Secondary | ICD-10-CM

## 2012-03-16 NOTE — Telephone Encounter (Signed)
Attempted to contact Patricia [name] via phone today. Dr. Jolayne Panther had reviewed her recent MAU results and had asked for me to contact her in order to change the appointment for this afternoon. The patient does not need to come to the appointment today because it would be more beneficial if she were seen after the follow-up US for her ovarian cyst. This has been scheduled for 05/18/12 @ 10:15 am. I was unable to get a hold of the patient at this time, and was no voicemail set up. I will attempt to contact her again later today in order to speak with her before her appointment this afternoon.

## 2012-03-17 ENCOUNTER — Encounter: Payer: Self-pay | Admitting: *Deleted

## 2012-03-17 NOTE — Progress Notes (Signed)
Patient ID: Patricia [name], female   DOB: Feb 22, 1976, 36 y.o.   MRN: [id number] No show for appointment

## 2012-03-17 NOTE — Progress Notes (Signed)
Patient no showed appointment yesterday.  Attempt was made to contact patient via phone to make her aware she needs a follow up ultrasound.  Certified letter mailed today.

## 2012-04-03 ENCOUNTER — Inpatient Hospital Stay (HOSPITAL_COMMUNITY)
Admission: EM | Admit: 2012-04-03 | Discharge: 2012-04-05 | DRG: 392 | Disposition: A | Discharge status: Home / Self Care | Payer: MEDICAID | Attending: Internal Medicine | Admitting: Internal Medicine

## 2012-04-03 ENCOUNTER — Emergency Department (HOSPITAL_COMMUNITY): Payer: Self-pay

## 2012-04-03 ENCOUNTER — Encounter (HOSPITAL_COMMUNITY): Payer: Self-pay | Admitting: *Deleted

## 2012-04-03 DIAGNOSIS — R112 Nausea with vomiting, unspecified: Secondary | ICD-10-CM | POA: Diagnosis present

## 2012-04-03 DIAGNOSIS — R1110 Vomiting, unspecified: Secondary | ICD-10-CM

## 2012-04-03 DIAGNOSIS — F3130 Bipolar disorder, current episode depressed, mild or moderate severity, unspecified: Secondary | ICD-10-CM | POA: Diagnosis present

## 2012-04-03 DIAGNOSIS — F259 Schizoaffective disorder, unspecified: Secondary | ICD-10-CM

## 2012-04-03 DIAGNOSIS — R109 Unspecified abdominal pain: Secondary | ICD-10-CM | POA: Diagnosis present

## 2012-04-03 DIAGNOSIS — F1410 Cocaine abuse, uncomplicated: Secondary | ICD-10-CM

## 2012-04-03 DIAGNOSIS — F250 Schizoaffective disorder, bipolar type: Secondary | ICD-10-CM

## 2012-04-03 DIAGNOSIS — Z79899 Other long term (current) drug therapy: Secondary | ICD-10-CM

## 2012-04-03 DIAGNOSIS — K5289 Other specified noninfective gastroenteritis and colitis: Principal | ICD-10-CM | POA: Diagnosis present

## 2012-04-03 DIAGNOSIS — D72829 Elevated white blood cell count, unspecified: Secondary | ICD-10-CM | POA: Diagnosis present

## 2012-04-03 DIAGNOSIS — E876 Hypokalemia: Secondary | ICD-10-CM | POA: Diagnosis not present

## 2012-04-03 DIAGNOSIS — F1190 Opioid use, unspecified, uncomplicated: Secondary | ICD-10-CM

## 2012-04-03 LAB — COMPREHENSIVE METABOLIC PANEL
AST: 31 U/L (ref 0–37)
Albumin: 4.9 g/dL (ref 3.5–5.2)
Alkaline Phosphatase: 82 U/L (ref 39–117)
BUN: 15 mg/dL (ref 6–23)
CO2: 20 mEq/L (ref 19–32)
Chloride: 97 mEq/L (ref 96–112)
Creatinine, Ser: 1.29 mg/dL — ABNORMAL HIGH (ref 0.50–1.10)
GFR calc non Af Amer: 53 mL/min — ABNORMAL LOW (ref >90)
Potassium: 3.7 mEq/L (ref 3.5–5.1)
Total Bilirubin: 0.5 mg/dL (ref 0.3–1.2)

## 2012-04-03 LAB — BASIC METABOLIC PANEL
BUN: 12 mg/dL (ref 6–23)
CO2: 18 mEq/L — ABNORMAL LOW (ref 19–32)
Calcium: 9.4 mg/dL (ref 8.4–10.5)
Creatinine, Ser: 0.88 mg/dL (ref 0.50–1.10)
GFR calc Af Amer: >90 mL/min (ref >90)

## 2012-04-03 LAB — CBC
HCT: 48.9 % — ABNORMAL HIGH (ref 36.0–46.0)
HCT: 43.5 % (ref 36.0–46.0)
MCHC: 34.7 g/dL (ref 30.0–36.0)
MCV: 87.8 fL (ref 78.0–100.0)
MCV: 86.5 fL (ref 78.0–100.0)
Platelets: 249 10*3/uL (ref 150–400)
RBC: 5.57 MIL/uL — ABNORMAL HIGH (ref 3.87–5.11)
RDW: 13 % (ref 11.5–15.5)
RDW: 13.3 % (ref 11.5–15.5)
WBC: 25.6 10*3/uL — ABNORMAL HIGH (ref 4.0–10.5)
WBC: 19.3 10*3/uL — ABNORMAL HIGH (ref 4.0–10.5)

## 2012-04-03 LAB — ETHANOL: Alcohol, Ethyl (B): <11 mg/dL (ref 0–11)

## 2012-04-03 LAB — PREGNANCY, URINE: Preg Test, Ur: NEGATIVE

## 2012-04-03 LAB — RAPID URINE DRUG SCREEN, HOSP PERFORMED
Amphetamines: NOT DETECTED
Barbiturates: NOT DETECTED
Benzodiazepines: POSITIVE — AB

## 2012-04-03 MED ORDER — ZIPRASIDONE MESYLATE 20 MG IM SOLR
10.0000 mg | Freq: Once | INTRAMUSCULAR | Status: AC
  Administered 2012-04-03: 10 mg via INTRAMUSCULAR
  Filled 2012-04-03: qty 20

## 2012-04-03 MED ORDER — ONDANSETRON HCL 4 MG PO TABS
4.0000 mg | ORAL_TABLET | Freq: Four times a day (QID) | ORAL | Status: DC | PRN
  Administered 2012-04-04: 4 mg via ORAL
  Filled 2012-04-03: qty 1

## 2012-04-03 MED ORDER — ILOPERIDONE 2 MG PO TABS
2.0000 mg | ORAL_TABLET | Freq: Two times a day (BID) | ORAL | Status: DC
Start: 2012-04-03 — End: 2012-04-05
  Administered 2012-04-03 – 2012-04-05 (×4): 2 mg via ORAL
  Filled 2012-04-03 (×8): qty 1

## 2012-04-03 MED ORDER — ONDANSETRON HCL 4 MG/2ML IJ SOLN: 4.0000 mg | INTRAMUSCULAR | Status: DC | PRN

## 2012-04-03 MED ORDER — ENOXAPARIN SODIUM 30 MG/0.3ML ~~LOC~~ SOLN
30.0000 mg | SUBCUTANEOUS | Status: DC
  Administered 2012-04-03: 30 mg via SUBCUTANEOUS
  Filled 2012-04-03 (×3): qty 0.3

## 2012-04-03 MED ORDER — SODIUM CHLORIDE 0.9 % IV SOLN
INTRAVENOUS | Status: DC
  Administered 2012-04-03 – 2012-04-04 (×5): via INTRAVENOUS

## 2012-04-03 MED ORDER — ONDANSETRON HCL 4 MG/2ML IJ SOLN
4.0000 mg | Freq: Four times a day (QID) | INTRAMUSCULAR | Status: DC | PRN
  Administered 2012-04-03: 4 mg via INTRAVENOUS
  Filled 2012-04-03: qty 2

## 2012-04-03 MED ORDER — DIAZEPAM 5 MG/ML IJ SOLN
5.0000 mg | Freq: Once | INTRAMUSCULAR | Status: AC
  Administered 2012-04-03: 5 mg via INTRAMUSCULAR

## 2012-04-03 MED ORDER — DIAZEPAM 5 MG/ML IJ SOLN
INTRAMUSCULAR | Status: AC
  Filled 2012-04-03: qty 2

## 2012-04-03 MED ORDER — HALOPERIDOL LACTATE 5 MG/ML IJ SOLN
5.0000 mg | Freq: Four times a day (QID) | INTRAMUSCULAR | Status: DC | PRN
  Administered 2012-04-03: 3 mg via INTRAVENOUS
  Filled 2012-04-03 (×3): qty 1

## 2012-04-03 MED ORDER — SODIUM CHLORIDE 0.9 % IV SOLN: INTRAVENOUS | Status: DC

## 2012-04-03 MED ORDER — METOCLOPRAMIDE HCL 5 MG/ML IJ SOLN
10.0000 mg | Freq: Once | INTRAMUSCULAR | Status: AC
  Administered 2012-04-03: 10 mg via INTRAVENOUS
  Filled 2012-04-03: qty 2

## 2012-04-03 MED ORDER — ACETAMINOPHEN 325 MG PO TABS
650.0000 mg | ORAL_TABLET | Freq: Four times a day (QID) | ORAL | Status: DC | PRN
  Administered 2012-04-05: 650 mg via ORAL
  Filled 2012-04-03: qty 2

## 2012-04-03 MED ORDER — DIAZEPAM 5 MG/ML IJ SOLN
5.0000 mg | Freq: Once | INTRAMUSCULAR | Status: AC
  Administered 2012-04-03: 5 mg via INTRAVENOUS
  Filled 2012-04-03: qty 2

## 2012-04-03 MED ORDER — ONDANSETRON HCL 4 MG PO TABS: 4.0000 mg | ORAL_TABLET | Freq: Four times a day (QID) | ORAL | Status: DC | PRN

## 2012-04-03 MED ORDER — TRAZODONE HCL 100 MG PO TABS
200.0000 mg | ORAL_TABLET | Freq: Every day | ORAL | Status: DC
  Administered 2012-04-03 – 2012-04-04 (×2): 200 mg via ORAL
  Filled 2012-04-03 (×3): qty 2

## 2012-04-03 MED ORDER — GABAPENTIN 300 MG PO CAPS
600.0000 mg | ORAL_CAPSULE | Freq: Four times a day (QID) | ORAL | Status: DC
  Administered 2012-04-03 – 2012-04-05 (×8): 600 mg via ORAL
  Filled 2012-04-03 (×11): qty 2

## 2012-04-03 MED ORDER — DIAZEPAM 5 MG PO TABS
10.0000 mg | ORAL_TABLET | Freq: Three times a day (TID) | ORAL | Status: DC | PRN
  Administered 2012-04-03 – 2012-04-05 (×6): 10 mg via ORAL
  Filled 2012-04-03 (×6): qty 2

## 2012-04-03 MED ORDER — PANTOPRAZOLE SODIUM 40 MG IV SOLR
40.0000 mg | Freq: Two times a day (BID) | INTRAVENOUS | Status: DC
  Administered 2012-04-03 – 2012-04-05 (×5): 40 mg via INTRAVENOUS
  Filled 2012-04-03 (×6): qty 40

## 2012-04-03 MED ORDER — HYDROMORPHONE HCL PF 1 MG/ML IJ SOLN: 1.0000 mg | INTRAMUSCULAR | Status: DC | PRN

## 2012-04-03 MED ORDER — ESCITALOPRAM OXALATE 20 MG PO TABS
20.0000 mg | ORAL_TABLET | Freq: Every day | ORAL | Status: DC
  Administered 2012-04-03 – 2012-04-05 (×3): 20 mg via ORAL
  Filled 2012-04-03 (×3): qty 1

## 2012-04-03 MED ORDER — ONDANSETRON HCL 4 MG/2ML IJ SOLN
4.0000 mg | Freq: Once | INTRAMUSCULAR | Status: AC
  Administered 2012-04-03: 4 mg via INTRAVENOUS
  Filled 2012-04-03: qty 2

## 2012-04-03 MED ORDER — ACETAMINOPHEN 650 MG RE SUPP: 650.0000 mg | Freq: Four times a day (QID) | RECTAL | Status: DC | PRN

## 2012-04-03 MED ORDER — HALOPERIDOL LACTATE 5 MG/ML IJ SOLN
2.0000 mg | Freq: Four times a day (QID) | INTRAMUSCULAR | Status: DC | PRN
  Administered 2012-04-03 (×2): 2 mg via INTRAVENOUS
  Filled 2012-04-03 (×2): qty 0.4

## 2012-04-03 MED ORDER — PROMETHAZINE HCL 25 MG RE SUPP
25.0000 mg | Freq: Four times a day (QID) | RECTAL | Status: DC | PRN
  Administered 2012-04-03: 25 mg via RECTAL
  Filled 2012-04-03: qty 1

## 2012-04-03 NOTE — ED Notes (Signed)
EDP made aware pt vomiting

## 2012-04-03 NOTE — ED Notes (Addendum)
Vomiting for the last 24 hrs  More than 20 times stated. Unable to take med or keep anything down. Stated went into manic state. Pt crying, shaking and very agitated. Hx of bipolar and schizoaffective disorder.

## 2012-04-03 NOTE — ED Notes (Signed)
Unable to place PIV, attempt twice and IV. team attempted twice unsuccessful

## 2012-04-03 NOTE — ED Notes (Addendum)
Patient here in manic state.  Patient has been sick and vomiting so she has not been able to take here hurt.  Patient complain of abdominal pain with n/v.  Patient is restless, agitated, and very anxious.  Patient is diaphoretic.  Per boyfriend, she has been sick all day with vomiting and nausea.

## 2012-04-03 NOTE — ED Notes (Addendum)
Pt crying, c/o "I feel like my body is turning inside out". Pt dry heaving, pt anxious. EDP made aware of patient.

## 2012-04-03 NOTE — ED Provider Notes (Signed)
History     CSN: [id number]  Arrival date & time 04/03/12  0154   First MD Initiated Contact with Patient 04/03/12 0203      Chief Complaint  Patient presents with  . Manic Behavior  . Nausea  . Emesis    (Consider location/radiation/quality/duration/timing/severity/associated sxs/prior treatment) Patient is a 36 y.o. female presenting with vomiting and mental health disorder. The history is provided by the patient.  Emesis  This is a chronic problem. The current episode started yesterday. The problem has not changed since onset.The emesis has an appearance of stomach contents. There has been no fever. Pertinent negatives include no abdominal pain, no fever and no sweats.  Mental Health Problem The primary symptoms include dysphoric mood. This is a chronic problem.  The degree of incapacity that she is experiencing as a consequence of her illness is mild. Additional symptoms of the illness include euphoric mood, increased goal-directed activity, flight of ideas and inflated self-esteem. Additional symptoms of the illness do not include no abdominal pain. She does not admit to suicidal ideas. She does not have a plan to commit suicide. She does not contemplate harming herself. She has not already injured self. She does not contemplate injuring another person. She has not already  injured another person. Risk factors that are present for mental illness include a history of mental illness and substance abuse.    Past Medical History  Diagnosis Date  . Depression   . Schizoaffective disorder, bipolar type     Past Surgical History  Procedure Date  . Cholecystectomy   . Appendectomy   . Laporoscopy     Family History  Problem Relation Age of Onset  . Diabetes Mother   . Hypertension Mother   . Hypertension Father     History  Substance Use Topics  . Smoking status: Never Smoker   . Smokeless tobacco: Not on file  . Alcohol Use: No    OB History    Grav Para Term  Preterm Abortions TAB SAB Ect Mult Living   5 3 2 1 2 1 1  0 0       Review of Systems  Constitutional: Negative for fever.  Gastrointestinal: Positive for nausea and vomiting. Negative for abdominal pain.  Psychiatric/Behavioral: Positive for dysphoric mood.  All other systems reviewed and are negative.    Allergies  Benzodiazepines; Penicillins; Rocephin; Ativan; and Zofran  Home Medications   Current Outpatient Rx  Name Route Sig Dispense Refill  . ESCITALOPRAM OXALATE 20 MG PO TABS Oral Take 1 tablet (20 mg total) by mouth daily. For depression. 30 tablet 0  . GABAPENTIN 300 MG PO CAPS Oral Take 2 capsules (600 mg total) by mouth 4 (four) times daily. For anxiety 240 capsule 0  . ILOPERIDONE 2 MG PO TABS Oral Take 1 tablet (2 mg total) by mouth 2 (two) times daily. For mood control and psychosis. 60 tablet 0  . TRAZODONE HCL 100 MG PO TABS Oral Take 2 tablets (200 mg total) by mouth at bedtime. For insomnia. 60 tablet 0    BP 130/92  Pulse 80  Temp 98.2 F (36.8 C) (Oral)  Resp 26  SpO2 100%  Physical Exam  Constitutional: She is oriented to person, place, and time. She appears well-developed and well-nourished.  HENT:  Head: Normocephalic and atraumatic.  Eyes: Conjunctivae and EOM are normal. Pupils are equal, round, and reactive to light.  Neck: Normal range of motion.  Cardiovascular: Regular rhythm and normal heart sounds.  Tachycardia present.   Pulmonary/Chest: Effort normal and breath sounds normal.  Abdominal: Soft. Bowel sounds are normal. She exhibits no distension. There is no tenderness.  Musculoskeletal: Normal range of motion.  Neurological: She is alert and oriented to person, place, and time.  Skin: Skin is warm and dry.  Psychiatric: Her speech is normal. Her mood appears anxious. Her affect is labile. She is is hyperactive. Thought content is not delusional. She expresses no homicidal and no suicidal ideation. She expresses no suicidal plans and no  homicidal plans.    ED Course  Procedures (including critical care time)   Labs Reviewed  CBC  COMPREHENSIVE METABOLIC PANEL  LIPASE, BLOOD  ETHANOL  URINE RAPID DRUG SCREEN (HOSP PERFORMED)   No results found.   No diagnosis found.    MDM  + cyclic vomiting..  Wbcs ~25k.  Neg head and abd scan.  Multiple antiemetics tried without resolution of symptoms.  Will admit         Rosanne Ashing, MD 04/07/12 [phone]

## 2012-04-03 NOTE — H&P (Addendum)
PCP: None  Chief Complaint:  Nausea and vomiting  HPI: Ms. Tiggs is a 36 year old female with history of bipolar disorder, schizoaffective disorder who has been admitted to [facility] multiple times in the last few months presented to the ER: With the above-mentioned complaint Patient is a very vague historian reports onset of nausea and vomiting 2 days ago, more dry heaving. This is associated with diffuse abdominal pain predominantly peri-umbilical which is constant without any relieving factors, aggravated by vomiting sometimes. She denies any hematemesis or melena. Denies any fevers, reports chills today. Denies any diarrhea last bowel movement was this morning. Upon evaluation the emergency room she was noted to have leukocytosis and an unremarkable CT abdomen pelvis without contrast . She denies any changes in her medicines recently, reports using NSAIDs 3 times a day   All to have leukocytosisergies:   Allergies  Allergen Reactions  . Benzodiazepines     Disinhibition, hallucinations.  . Penicillins Anaphylaxis  . Rocephin (Ceftriaxone Sodium In Dextrose) Anaphylaxis  . Ativan (Lorazepam)     Sensitive. Made patient feel "crazy"  . Zofran (Ondansetron Hcl) Other (See Comments)    Makes patient skin turn red and feels hot to touch      Past Medical History  Diagnosis Date  . Depression   . Schizoaffective disorder, bipolar type     Past Surgical History  Procedure Date  . Cholecystectomy   . Appendectomy   . Laporoscopy     Prior to Admission medications   Medication Sig Start Date End Date Taking? Authorizing Provider  escitalopram (LEXAPRO) 20 MG tablet Take 1 tablet (20 mg total) by mouth daily. For depression. 03/04/12 06/02/12 Yes Mike Craze, MD  gabapentin (NEURONTIN) 300 MG capsule Take 2 capsules (600 mg total) by mouth 4 (four) times daily. For anxiety 03/04/12 03/04/13 Yes Mike Craze, MD  Iloperidone (FANAPT) 2 MG TABS Take 1 tablet (2 mg total) by mouth 2  (two) times daily. For mood control and psychosis. 03/04/12  Yes Mike Craze, MD  traZODone (DESYREL) 100 MG tablet Take 2 tablets (200 mg total) by mouth at bedtime. For insomnia. 03/04/12 04/03/12 Yes Mike Craze, MD    Social History:  reports that she has never smoked. She does not have any smokeless tobacco history on file. She reports that she uses illicit drugs (Cocaine). She reports that she does not drink alcohol.  Family History  Problem Relation Age of Onset  . Diabetes Mother   . Hypertension Mother   . Hypertension Father     Review of Systems:  Constitutional: Denies fever, chills, diaphoresis, appetite change and fatigue.  HEENT: Denies photophobia, eye pain, redness, hearing loss, ear pain, congestion, sore throat, rhinorrhea, sneezing, mouth sores, trouble swallowing, neck pain, neck stiffness and tinnitus.   Respiratory: Denies SOB, DOE, cough, chest tightness,  and wheezing.   Cardiovascular: Denies chest pain, palpitations and leg swelling.  Gastrointestinal: Denies nausea, vomiting, abdominal pain, diarrhea, constipation, blood in stool and abdominal distention.  Genitourinary: Denies dysuria, urgency, frequency, hematuria, flank pain and difficulty urinating.  Musculoskeletal: Denies myalgias, back pain, joint swelling, arthralgias and gait problem.  Skin: Denies pallor, rash and wound.  Neurological: Denies dizziness, seizures, syncope, weakness, light-headedness, numbness and headaches.  Hematological: Denies adenopathy. Easy bruising, personal or family bleeding history  Psychiatric/Behavioral: Denies suicidal ideation, mood changes, confusion, nervousness, sleep disturbance and agitation   Physical Exam: Blood pressure 130/92, pulse 112, temperature 98.7 F (37.1 C), temperature source Oral, resp. rate  22, height 5\' 8"  (1.727 m), weight 91.627 kg (202 lb), last menstrual period 03/19/2012, SpO2 98.00%.  general : Alert awake oriented x3, very anxious and  restless   H. EENT: Pupils equal reactive to light oral mucosa is dry, several missing teeth, no JVD or lymphadenopathy CVS S1-S2 regular rate rhythm no murmurs rubs or gallops Lungs clear to auscultation bilaterally Abdomen soft obese nontender and nondistended no rigidity or rebound no organomegaly no CVA tenderness Extremities no edema clubbing or cyanosis Neuro no localizing signs Psychiatric: Appears almost in a manic-like state  Labs on Admission:  Results for orders placed during the hospital encounter of 04/03/12 (from the past 48 hour(s))  CBC     Status: Abnormal   Collection Time   04/03/12  3:00 AM      Component Value Range Comment   WBC 25.6 (*) 4.0 - 10.5 K/uL    RBC 5.57 (*) 3.87 - 5.11 MIL/uL    Hemoglobin 16.8 (*) 12.0 - 15.0 g/dL    HCT 16.1 (*) 09.6 - 46.0 %    MCV 87.8  78.0 - 100.0 fL    MCH 30.2  26.0 - 34.0 pg    MCHC 34.4  30.0 - 36.0 g/dL    RDW 04.5  40.9 - 81.1 %    Platelets 287  150 - 400 K/uL   COMPREHENSIVE METABOLIC PANEL     Status: Abnormal   Collection Time   04/03/12  3:00 AM      Component Value Range Comment   Sodium 139  135 - 145 mEq/L    Potassium 3.7  3.5 - 5.1 mEq/L    Chloride 97  96 - 112 mEq/L    CO2 20  19 - 32 mEq/L    Glucose, Bld 126 (*) 70 - 99 mg/dL    BUN 15  6 - 23 mg/dL    Creatinine, Ser 9.14 (*) 0.50 - 1.10 mg/dL    Calcium 78.2 (*) 8.4 - 10.5 mg/dL    Total Protein 8.8 (*) 6.0 - 8.3 g/dL    Albumin 4.9  3.5 - 5.2 g/dL    AST 31  0 - 37 U/L    ALT 45 (*) 0 - 35 U/L    Alkaline Phosphatase 82  39 - 117 U/L    Total Bilirubin 0.5  0.3 - 1.2 mg/dL    GFR calc non Af Amer 53 (*) >90 mL/min    GFR calc Af Amer 61 (*) >90 mL/min   LIPASE, BLOOD     Status: Normal   Collection Time   04/03/12  3:00 AM      Component Value Range Comment   Lipase 15  11 - 59 U/L   ETHANOL     Status: Normal   Collection Time   04/03/12  3:00 AM      Component Value Range Comment   Alcohol, Ethyl (B) <11  0 - 11 mg/dL   URINE RAPID DRUG  SCREEN (HOSP PERFORMED)     Status: Abnormal   Collection Time   04/03/12  7:19 AM      Component Value Range Comment   Opiates NONE DETECTED  NONE DETECTED    Cocaine NONE DETECTED  NONE DETECTED    Benzodiazepines POSITIVE (*) NONE DETECTED    Amphetamines NONE DETECTED  NONE DETECTED    Tetrahydrocannabinol NONE DETECTED  NONE DETECTED    Barbiturates NONE DETECTED  NONE DETECTED   PREGNANCY, URINE  Status: Normal   Collection Time   04/03/12  7:19 AM      Component Value Range Comment   Preg Test, Ur NEGATIVE  NEGATIVE     Radiological Exams on Admission: Ct Abdomen Pelvis Wo Contrast  04/03/2012  *RADIOLOGY REPORT*  Clinical Data: Abdominal pain, nausea and vomiting.  CT ABDOMEN AND PELVIS WITHOUT CONTRAST  Technique:  Multidetector CT imaging of the abdomen and pelvis was performed following the standard protocol without intravenous contrast.  Comparison: None.  Findings: The lung bases are clear.  Small esophageal hiatal hernia.  Diffuse fatty infiltration of the liver.  Surgical absence of the gallbladder.  The unenhanced appearance of the spleen, pancreas, adrenal glands, kidneys, abdominal aorta, and retroperitoneal lymph nodes is unremarkable.  Stomach, small bowel, and colon are mostly decompressed.  No free fluid or free air in the abdomen.  Pelvis:  The bladder is incompletely distended.  The uterus and adnexal structures do not appear enlarged.  No free or loculated pelvic fluid collections.  No evidence of diverticulitis.  The appendix is not visualized.  Normal alignment of the lumbar vertebrae.  IMPRESSION: Diffuse fatty infiltration of the liver. Small esophageal hiatal hernia.  Surgical absence of the gallbladder.  The unenhanced abdomen and pelvis is otherwise unremarkable.  Original Report Authenticated By: Marlon Pel, M.D.   Ct Head Wo Contrast  04/03/2012  *RADIOLOGY REPORT*  Clinical Data: Abdominal pain, nausea and vomiting.  CT HEAD WITHOUT CONTRAST   Technique:  Contiguous axial images were obtained from the base of the skull through the vertex without contrast.  Comparison: 05/30/2010  Findings: Mild cerebral atrophy for patient's age.  No mass effect or midline shift.  No abnormal extra-axial fluid collections.  Wallace Cullens- white matter junctions are distinct.  Basal cisterns are not effaced.  No acute intracranial hemorrhage.  No depressed skull fractures.  Visualized paranasal sinuses and mastoid air cells are not opacified.  No significant change since previous study.  IMPRESSION: No acute intracranial abnormalities.  Original Report Authenticated By: Marlon Pel, M.D.    Assessment/Plan 1.Nausea/vomiting/abdominal pain Unclear etiology NSAIDs induced gastritis is a possibility Benzodiazepine withdrawal also could be playing a role Will treat with supportive care, clear liquid diet IV PPI twice a day, antiemetics, pain control If her symptoms do not improve will consider a GI evaluation versus repeating the CT with contrast  2. Leukocytosis: Reactive, hemoconcentration possibly contributing as well Will repeat CBC with differential, check lactic acid  Continue to monitor  3. Bipolar and schizoaffective disorder continue prior antipsychotics  4. DVT prophylaxis with Lovenox  Further management his condition evolves    Time Spent on Admission:  Enora Trillo [facility] Pager: [phone] 04/03/2012, 11:43 AM

## 2012-04-03 NOTE — Progress Notes (Signed)
Patricia [name] [id number] Code Status: full   Admission Data: 04/03/2012 1:01 PM Attending Provider:  Mitchel Honour PCP:No primary provider on file. Consults/ Treatment Team:    Patricia [name] is a 36 y.o. female patient admitted from ED  No acute distress noted.  No c/o shortness of breath, no c/o chest pain.  Cardiac tele # [phone], in place, cardiac monitor yields:sinus tachycardia.  Blood pressure 130/92, pulse 112, temperature 98.7 F (37.1 C), temperature source Oral, resp. rate 22, height 5\' 8"  (1.727 m), weight 91.627 kg (202 lb), last menstrual period 03/19/2012, SpO2 98.00%.  O2:   @ 0 LPM via na  IV Fluids:  IV in place, occlusive dsg intact without redness, IV cath foot right, condition patent and no redness normal saline.   Allergies:  Benzodiazepines; Penicillins; Rocephin; Ativan; and Zofran  Past Medical History:   has a past medical history of Depression and Schizoaffective disorder, bipolar type.  Past Surgical History:   has past surgical history that includes Cholecystectomy; Appendectomy; and laporoscopy.  Social History:   reports that she has never smoked. She does not have any smokeless tobacco history on file. She reports that she uses illicit drugs (Cocaine). She reports that she does not drink alcohol.  Skin: Intact  Orientation to room, and floor completed with information packet given to patient/family. Admission INP armband ID verified with patient/family, and in place.   SR up x 2, fall assessment complete, explained fall risk at this time, asked to use call bell when she needs something. Call light within reach. Patient very manic at this time and does not verbalize understanding to any instructions given.        Will cont to eval and treat per MD orders.  Susann Givens, RN, [facility] 04/03/2012 1:01 PM

## 2012-04-03 NOTE — Care Management Note (Signed)
    Page 1 of 1   04/06/2012     2:16:06 PM   CARE MANAGEMENT NOTE 04/06/2012  Patient:  Patricia [name], Patricia [name]   Account Number:  [id number]  Date Initiated:  04/03/2012  Documentation initiated by:  Letha Cape  Subjective/Objective Assessment:   dx n/v  admit- lives with boyfriend. pta independent.  Patient was at [facility] on previous admission.     Action/Plan:   Anticipated DC Date:  04/05/2012   Anticipated DC Plan:  HOME/SELF CARE      DC Planning Services  CM consult  Medication Assistance      Choice offered to / List presented to:             Status of service:  Completed, signed off Medicare Important Message given?   (If response is "NO", the following Medicare IM given date fields will be blank) Date Medicare IM given:   Date Additional Medicare IM given:    Discharge Disposition:  HOME/SELF CARE  Per UR Regulation:  Reviewed for med. necessity/level of care/duration of stay  If discussed at Long Length of Stay Meetings, dates discussed:    Comments:  04/05/2012 1200 Spoke to pt and states she goes to [facility] in [place]. Sees Dr Janae Sauce at Clinic. She pays $17 for Lexapro for 3 month supply. Currently she does not have any Neurontin at home. She is not working and does not have any insurance coverage. Contacted main pharmacy and pt is eligible for ZZ fund. Will get 3 day supply of Neurontin. Explained to pt that fund can be used once per year. Isidoro Donning RN CCM Case Mgmt phone [phone]  04/03/12 17:49 Letha Cape RN, BSN [phone] patient lives with boyfriend, pta independent.  Patient will need medication ast at dc, she is eligble. Patient has transportation.  NCM will continue to follow for dc needs.

## 2012-04-04 DIAGNOSIS — F319 Bipolar disorder, unspecified: Secondary | ICD-10-CM

## 2012-04-04 DIAGNOSIS — R1110 Vomiting, unspecified: Secondary | ICD-10-CM

## 2012-04-04 LAB — CBC
MCH: 29.2 pg (ref 26.0–34.0)
MCHC: 32.9 g/dL (ref 30.0–36.0)
Platelets: 172 10*3/uL (ref 150–400)
RBC: 4.08 MIL/uL (ref 3.87–5.11)

## 2012-04-04 LAB — COMPREHENSIVE METABOLIC PANEL
ALT: 27 U/L (ref 0–35)
AST: 22 U/L (ref 0–37)
Albumin: 3.1 g/dL — ABNORMAL LOW (ref 3.5–5.2)
CO2: 25 mEq/L (ref 19–32)
Calcium: 7.6 mg/dL — ABNORMAL LOW (ref 8.4–10.5)
Sodium: 142 mEq/L (ref 135–145)
Total Protein: 5.5 g/dL — ABNORMAL LOW (ref 6.0–8.3)

## 2012-04-04 MED ORDER — HALOPERIDOL LACTATE 5 MG/ML IJ SOLN
2.0000 mg | Freq: Four times a day (QID) | INTRAMUSCULAR | Status: DC | PRN
  Filled 2012-04-04: qty 0.4

## 2012-04-04 MED ORDER — ENOXAPARIN SODIUM 40 MG/0.4ML ~~LOC~~ SOLN
40.0000 mg | SUBCUTANEOUS | Status: DC
  Administered 2012-04-04: 40 mg via SUBCUTANEOUS
  Filled 2012-04-04 (×2): qty 0.4

## 2012-04-04 MED ORDER — SODIUM CHLORIDE 0.9 % IJ SOLN: 10.0000 mL | INTRAMUSCULAR | Status: DC | PRN

## 2012-04-04 MED ORDER — POTASSIUM CHLORIDE 10 MEQ/100ML IV SOLN
10.0000 meq | INTRAVENOUS | Status: AC
  Administered 2012-04-04 (×4): 10 meq via INTRAVENOUS
  Filled 2012-04-04 (×4): qty 100

## 2012-04-04 NOTE — Progress Notes (Signed)
PATIENT DETAILS Name: Patricia [name] Age: 36 y.o. Sex: female Date of Birth: 02-05-1976 Admit Date: 04/03/2012 Admitting Physician Zannie Cove, MD PCP:No primary provider on file.  Subjective: No vomiting overnight-tolerating clears. Very calm today.  Assessment/Plan: Active Problems:  Nausea and vomiting -etiology is not very evident at present-abdomen is soft and very benign appearing. Last BM yesterday pm as well.? Gastroenteritis-?viral -Lipase normal on admission -will slowly advance diet -continue with supportive care in interim   Leukocytosis -likely reactive -non toxic looking and afebrile -will recheck CBC once PICC line in place   Abdominal pain -seems to have resolved this am -abd is soft and benign appearing --CT Abdomen/Pelvis-negative-done 7/19  Bipolar and schizoaffective disorder  -today seems to be very pleasant and calm, yesterday she was very agitated and did require haldol -continue with psych meds  Disposition: Remain inpatient  DVT Prophylaxis: Prophylactic Lovenox  Code Status: Full code   Procedures:  None  CONSULTS:  None  PHYSICAL EXAM: Vital signs in last 24 hours: Filed Vitals:   04/03/12 1040 04/03/12 1500 04/03/12 2136 04/04/12 0549  BP: 130/92 143/84 106/62 111/69  Pulse: 112 118 99 94  Temp:  99.6 F (37.6 C) 99.1 F (37.3 C) 98.2 F (36.8 C)  TempSrc:  Oral Oral Oral  Resp:  20 18 20   Height:      Weight:      SpO2:  97% 94% 92%    Weight change:  Body mass index is 30.71 kg/(m^2).   Gen Exam: Awake and alert with clear speech.   Neck: Supple, No JVD.   Chest: B/L Clear.   CVS: S1 S2 Regular, no murmurs.  Abdomen: soft, BS +, non tender, non distended.  Extremities: no edema, lower extremities warm to touch. Neurologic: Non Focal.  Skin: No Rash.   Wounds: N/A.    Intake/Output from previous day:  Intake/Output Summary (Last 24 hours) at 04/04/12 1022 Last data filed at 04/04/12 0643  Gross per 24  hour  Intake 1301.67 ml  Output      0 ml  Net 1301.67 ml     LAB RESULTS: CBC  Lab 04/03/12 1351 04/03/12 0300  WBC 19.3* 25.6*  HGB 15.1* 16.8*  HCT 43.5 48.9*  PLT 249 287  MCV 86.5 87.8  MCH 30.0 30.2  MCHC 34.7 34.4  RDW 13.3 13.0  LYMPHSABS -- --  MONOABS -- --  EOSABS -- --  BASOSABS -- --  BANDABS -- --    Chemistries   Lab 04/03/12 1351 04/03/12 0300  NA 146* 139  K 4.5 3.7  CL 107 97  CO2 18* 20  GLUCOSE 115* 126*  BUN 12 15  CREATININE 0.88 1.29*  CALCIUM 9.4 11.0*  MG -- --    CBG: No results found for this basename: GLUCAP:5 in the last 168 hours  GFR Estimated Creatinine Clearance: 105.6 ml/min (by C-G formula based on Cr of 0.88).  Coagulation profile No results found for this basename: INR:5,PROTIME:5 in the last 168 hours  Cardiac Enzymes No results found for this basename: CK:3,CKMB:3,TROPONINI:3,MYOGLOBIN:3 in the last 168 hours  No components found with this basename: POCBNP:3 No results found for this basename: DDIMER:2 in the last 72 hours No results found for this basename: HGBA1C:2 in the last 72 hours No results found for this basename: CHOL:2,HDL:2,LDLCALC:2,TRIG:2,CHOLHDL:2,LDLDIRECT:2 in the last 72 hours No results found for this basename: TSH,T4TOTAL,FREET3,T3FREE,THYROIDAB in the last 72 hours No results found for this basename: VITAMINB12:2,FOLATE:2,FERRITIN:2,TIBC:2,IRON:2,RETICCTPCT:2 in the last 72 hours  Basename 04/03/12 0300  LIPASE 15  AMYLASE --    Urine Studies No results found for this basename: UACOL:2,UAPR:2,USPG:2,UPH:2,UTP:2,UGL:2,UKET:2,UBIL:2,UHGB:2,UNIT:2,UROB:2,ULEU:2,UEPI:2,UWBC:2,URBC:2,UBAC:2,CAST:2,CRYS:2,UCOM:2,BILUA:2 in the last 72 hours  MICROBIOLOGY: No results found for this or any previous visit (from the past 240 hour(s)).  RADIOLOGY STUDIES/RESULTS: Ct Abdomen Pelvis Wo Contrast  04/03/2012  *RADIOLOGY REPORT*  Clinical Data: Abdominal pain, nausea and vomiting.  CT ABDOMEN AND  PELVIS WITHOUT CONTRAST  Technique:  Multidetector CT imaging of the abdomen and pelvis was performed following the standard protocol without intravenous contrast.  Comparison: None.  Findings: The lung bases are clear.  Small esophageal hiatal hernia.  Diffuse fatty infiltration of the liver.  Surgical absence of the gallbladder.  The unenhanced appearance of the spleen, pancreas, adrenal glands, kidneys, abdominal aorta, and retroperitoneal lymph nodes is unremarkable.  Stomach, small bowel, and colon are mostly decompressed.  No free fluid or free air in the abdomen.  Pelvis:  The bladder is incompletely distended.  The uterus and adnexal structures do not appear enlarged.  No free or loculated pelvic fluid collections.  No evidence of diverticulitis.  The appendix is not visualized.  Normal alignment of the lumbar vertebrae.  IMPRESSION: Diffuse fatty infiltration of the liver. Small esophageal hiatal hernia.  Surgical absence of the gallbladder.  The unenhanced abdomen and pelvis is otherwise unremarkable.  Original Report Authenticated By: Marlon Pel, M.D.   Ct Head Wo Contrast  04/03/2012  *RADIOLOGY REPORT*  Clinical Data: Abdominal pain, nausea and vomiting.  CT HEAD WITHOUT CONTRAST  Technique:  Contiguous axial images were obtained from the base of the skull through the vertex without contrast.  Comparison: 05/30/2010  Findings: Mild cerebral atrophy for patient's age.  No mass effect or midline shift.  No abnormal extra-axial fluid collections.  Wallace Cullens- white matter junctions are distinct.  Basal cisterns are not effaced.  No acute intracranial hemorrhage.  No depressed skull fractures.  Visualized paranasal sinuses and mastoid air cells are not opacified.  No significant change since previous study.  IMPRESSION: No acute intracranial abnormalities.  Original Report Authenticated By: Marlon Pel, M.D.    MEDICATIONS: Scheduled Meds:   . enoxaparin (LOVENOX) injection  30 mg  Subcutaneous Q24H  . escitalopram  20 mg Oral Daily  . gabapentin  600 mg Oral QID  . Iloperidone  2 mg Oral BID  . ondansetron (ZOFRAN) IV  4 mg Intravenous Once  . pantoprazole (PROTONIX) IV  40 mg Intravenous Q12H  . traZODone  200 mg Oral QHS  . DISCONTD: sodium chloride   Intravenous STAT   Continuous Infusions:   . sodium chloride 100 mL/hr at 04/04/12 0657   PRN Meds:.acetaminophen, acetaminophen, diazepam, haloperidol lactate, HYDROmorphone (DILAUDID) injection, ondansetron (ZOFRAN) IV, ondansetron, sodium chloride, DISCONTD: haloperidol lactate, DISCONTD: ondansetron (ZOFRAN) IV, DISCONTD: ondansetron, DISCONTD: promethazine  Antibiotics: Anti-infectives    None       Jeoffrey Massed, MD  [facility] Pager:[phone]  If 7PM-7AM, please contact night-coverage [email or web address] Password [email or web address] 04/04/2012, 10:22 AM   LOS: 1 day

## 2012-04-04 NOTE — Consult Note (Signed)
Reason for Consult: Bipolar d/o Referring Physician: unknown  Patricia [name] is an 36 y.o. female.  HPI: Patricia [name] is a 36 year old female with history of bipolar disorder, schizoaffective disorder who has been admitted to [facility] multiple times in the last few months admitted for GI symptoms.  Patient  reports onset of nausea and vomiting 2 days ago, more dry heaving. This is associated with diffuse abdominal pain predominantly peri-umbilical which is constant without any relieving factors, aggravated by vomiting sometimes. She denies any changes in her medicines recently, reports using NSAIDs 3 times a day.  Thinks he psy meds are working well. And her GI symptoms are better today. Does not want to change her psy meds now. Able to take her meds daily and keep her follow up regularly now. No safety concerns right now. Thinks her mood is fair today.   Past Medical History  Diagnosis Date  . Depression   . Schizoaffective disorder, bipolar type     Past Surgical History  Procedure Date  . Tonsillectomy and adenoidectomy     "when I was a child"  . Appendectomy     "I was a child"  . Cholecystectomy 2001  . Ovarian cyst removal ~ 2003    laparoscopically; left    Family History  Problem Relation Age of Onset  . Diabetes Mother   . Hypertension Mother   . Hypertension Father     Social History:  reports that she has never smoked. She has never used smokeless tobacco. She reports that she drinks alcohol. She reports that she uses illicit drugs (Cocaine).  Allergies:  Allergies  Allergen Reactions  . Ativan (Lorazepam) Other (See Comments)    Sensitive. Made patient feel "crazy"  . Benzodiazepines Other (See Comments)    Disinhibition, hallucinations. 04/03/12 "I do fine w/Valium"  . Penicillins Anaphylaxis  . Rocephin (Ceftriaxone Sodium In Dextrose) Anaphylaxis  . Zofran (Ondansetron Hcl) Other (See Comments)    Makes patient skin turn red and feels hot to touch     Medications: I have reviewed the patient's current medications.  Results for orders placed during the hospital encounter of 04/03/12 (from the past 48 hour(s))  CBC     Status: Abnormal   Collection Time   04/03/12  3:00 AM      Component Value Range Comment   WBC 25.6 (*) 4.0 - 10.5 K/uL    RBC 5.57 (*) 3.87 - 5.11 MIL/uL    Hemoglobin 16.8 (*) 12.0 - 15.0 g/dL    HCT 16.1 (*) 09.6 - 46.0 %    MCV 87.8  78.0 - 100.0 fL    MCH 30.2  26.0 - 34.0 pg    MCHC 34.4  30.0 - 36.0 g/dL    RDW 04.5  40.9 - 81.1 %    Platelets 287  150 - 400 K/uL   COMPREHENSIVE METABOLIC PANEL     Status: Abnormal   Collection Time   04/03/12  3:00 AM      Component Value Range Comment   Sodium 139  135 - 145 mEq/L    Potassium 3.7  3.5 - 5.1 mEq/L    Chloride 97  96 - 112 mEq/L    CO2 20  19 - 32 mEq/L    Glucose, Bld 126 (*) 70 - 99 mg/dL    BUN 15  6 - 23 mg/dL    Creatinine, Ser 9.14 (*) 0.50 - 1.10 mg/dL    Calcium 78.2 (*) 8.4 - 10.5 mg/dL  Total Protein 8.8 (*) 6.0 - 8.3 g/dL    Albumin 4.9  3.5 - 5.2 g/dL    AST 31  0 - 37 U/L    ALT 45 (*) 0 - 35 U/L    Alkaline Phosphatase 82  39 - 117 U/L    Total Bilirubin 0.5  0.3 - 1.2 mg/dL    GFR calc non Af Amer 53 (*) >90 mL/min    GFR calc Af Amer 61 (*) >90 mL/min   LIPASE, BLOOD     Status: Normal   Collection Time   04/03/12  3:00 AM      Component Value Range Comment   Lipase 15  11 - 59 U/L   ETHANOL     Status: Normal   Collection Time   04/03/12  3:00 AM      Component Value Range Comment   Alcohol, Ethyl (B) <11  0 - 11 mg/dL   URINE RAPID DRUG SCREEN (HOSP PERFORMED)     Status: Abnormal   Collection Time   04/03/12  7:19 AM      Component Value Range Comment   Opiates NONE DETECTED  NONE DETECTED    Cocaine NONE DETECTED  NONE DETECTED    Benzodiazepines POSITIVE (*) NONE DETECTED    Amphetamines NONE DETECTED  NONE DETECTED    Tetrahydrocannabinol NONE DETECTED  NONE DETECTED    Barbiturates NONE DETECTED  NONE DETECTED    PREGNANCY, URINE     Status: Normal   Collection Time   04/03/12  7:19 AM      Component Value Range Comment   Preg Test, Ur NEGATIVE  NEGATIVE   LACTIC ACID, PLASMA     Status: Normal   Collection Time   04/03/12  1:50 PM      Component Value Range Comment   Lactic Acid, Venous 1.4  0.5 - 2.2 mmol/L   BASIC METABOLIC PANEL     Status: Abnormal   Collection Time   04/03/12  1:51 PM      Component Value Range Comment   Sodium 146 (*) 135 - 145 mEq/L DELTA CHECK NOTED   Potassium 4.5  3.5 - 5.1 mEq/L DELTA CHECK NOTED   Chloride 107  96 - 112 mEq/L DELTA CHECK NOTED   CO2 18 (*) 19 - 32 mEq/L    Glucose, Bld 115 (*) 70 - 99 mg/dL    BUN 12  6 - 23 mg/dL    Creatinine, Ser 0.86  0.50 - 1.10 mg/dL    Calcium 9.4  8.4 - 57.8 mg/dL    GFR calc non Af Amer 84 (*) >90 mL/min    GFR calc Af Amer >90  >90 mL/min   CBC     Status: Abnormal   Collection Time   04/03/12  1:51 PM      Component Value Range Comment   WBC 19.3 (*) 4.0 - 10.5 K/uL    RBC 5.03  3.87 - 5.11 MIL/uL    Hemoglobin 15.1 (*) 12.0 - 15.0 g/dL    HCT 46.9  62.9 - 52.8 %    MCV 86.5  78.0 - 100.0 fL    MCH 30.0  26.0 - 34.0 pg    MCHC 34.7  30.0 - 36.0 g/dL    RDW 41.3  24.4 - 01.0 %    Platelets 249  150 - 400 K/uL   CBC     Status: Abnormal   Collection Time   04/04/12 10:00 AM  Component Value Range Comment   WBC 9.0  4.0 - 10.5 K/uL    RBC 4.08  3.87 - 5.11 MIL/uL    Hemoglobin 11.9 (*) 12.0 - 15.0 g/dL DELTA CHECK NOTED   HCT 36.2  36.0 - 46.0 %    MCV 88.7  78.0 - 100.0 fL    MCH 29.2  26.0 - 34.0 pg    MCHC 32.9  30.0 - 36.0 g/dL    RDW 64.4  03.4 - 74.2 %    Platelets 172  150 - 400 K/uL DELTA CHECK NOTED  COMPREHENSIVE METABOLIC PANEL     Status: Abnormal   Collection Time   04/04/12 10:00 AM      Component Value Range Comment   Sodium 142  135 - 145 mEq/L    Potassium 2.5 (*) 3.5 - 5.1 mEq/L    Chloride 107  96 - 112 mEq/L    CO2 25  19 - 32 mEq/L    Glucose, Bld 93  70 - 99 mg/dL    BUN 7   6 - 23 mg/dL    Creatinine, Ser 5.95  0.50 - 1.10 mg/dL    Calcium 7.6 (*) 8.4 - 10.5 mg/dL    Total Protein 5.5 (*) 6.0 - 8.3 g/dL    Albumin 3.1 (*) 3.5 - 5.2 g/dL    AST 22  0 - 37 U/L    ALT 27  0 - 35 U/L    Alkaline Phosphatase 47  39 - 117 U/L    Total Bilirubin 0.4  0.3 - 1.2 mg/dL    GFR calc non Af Amer >90  >90 mL/min    GFR calc Af Amer >90  >90 mL/min   LIPASE, BLOOD     Status: Normal   Collection Time   04/04/12 10:00 AM      Component Value Range Comment   Lipase 21  11 - 59 U/L     Ct Abdomen Pelvis Wo Contrast  04/03/2012  *RADIOLOGY REPORT*  Clinical Data: Abdominal pain, nausea and vomiting.  CT ABDOMEN AND PELVIS WITHOUT CONTRAST  Technique:  Multidetector CT imaging of the abdomen and pelvis was performed following the standard protocol without intravenous contrast.  Comparison: None.  Findings: The lung bases are clear.  Small esophageal hiatal hernia.  Diffuse fatty infiltration of the liver.  Surgical absence of the gallbladder.  The unenhanced appearance of the spleen, pancreas, adrenal glands, kidneys, abdominal aorta, and retroperitoneal lymph nodes is unremarkable.  Stomach, small bowel, and colon are mostly decompressed.  No free fluid or free air in the abdomen.  Pelvis:  The bladder is incompletely distended.  The uterus and adnexal structures do not appear enlarged.  No free or loculated pelvic fluid collections.  No evidence of diverticulitis.  The appendix is not visualized.  Normal alignment of the lumbar vertebrae.  IMPRESSION: Diffuse fatty infiltration of the liver. Small esophageal hiatal hernia.  Surgical absence of the gallbladder.  The unenhanced abdomen and pelvis is otherwise unremarkable.  Original Report Authenticated By: Marlon Pel, M.D.   Ct Head Wo Contrast  04/03/2012  *RADIOLOGY REPORT*  Clinical Data: Abdominal pain, nausea and vomiting.  CT HEAD WITHOUT CONTRAST  Technique:  Contiguous axial images were obtained from the base of the  skull through the vertex without contrast.  Comparison: 05/30/2010  Findings: Mild cerebral atrophy for patient's age.  No mass effect or midline shift.  No abnormal extra-axial fluid collections.  Wallace Cullens- white matter junctions are  distinct.  Basal cisterns are not effaced.  No acute intracranial hemorrhage.  No depressed skull fractures.  Visualized paranasal sinuses and mastoid air cells are not opacified.  No significant change since previous study.  IMPRESSION: No acute intracranial abnormalities.  Original Report Authenticated By: Marlon Pel, M.D.    ROS Blood pressure 116/77, pulse 82, temperature 97.5 F (36.4 C), temperature source Oral, resp. rate 18, height 5\' 8"  (1.727 m), weight 91.627 kg (202 lb), last menstrual period 03/19/2012, SpO2 98.00%. Physical Exam  Lying on bed  Mental Status Examination/Evaluation:  Appearance: on bed  Eye Contact:: Good  Speech: normal  Volume: Normal  Mood: better  Affect: ristricted  Thought Process: organized  Orientation: Full  Thought Content: no AVH  Suicidal Thoughts: No  Homicidal Thoughts: no  Memory: Recent; intact  Judgement: fair  Insight: fair  Psychomotor Activity: Normal  Concentration: Fair  Recall: Fair  Akathisia: No  Assessment:  AXIS I:bipolatr d.o nos, schizoaffective d/o   AXIS II: Deferred  AXIS III: see medical hx ?  AXIS IV: problems related to social environment, problems with access to health care services and problems with primary support group  AXIS V: 55  ?  Treatment Plan/Recommendations:  1.continue current psy meds at this time as she is stable per pt on these meds.  Pt also wants her psy med refills at discharge   2. Will continue to follow  Wonda Cerise 04/04/2012, 7:10 PM

## 2012-04-04 NOTE — Progress Notes (Signed)
CRITICAL VALUE ALERT  Critical value received:  *potassium 2.5  Date of notification:  04/04/2012  Time of notification:  1101  Critical value read back:yes  Nurse who received alert:  WJ  MD notified (1st page):  Ghimire  Time of text page:  1102

## 2012-04-04 NOTE — Progress Notes (Signed)
Peripherally Inserted Central Catheter/Midline Placement  The IV Nurse has discussed with the patient and/or persons authorized to consent for the patient, the purpose of this procedure and the potential benefits and risks involved with this procedure.  The benefits include less needle sticks, lab draws from the catheter and patient may be discharged home with the catheter.  Risks include, but not limited to, infection, bleeding, blood clot (thrombus formation), and puncture of an artery; nerve damage and irregular heat beat.  Alternatives to this procedure were also discussed.  PICC/Midline Placement Documentation        Patricia [name] 04/04/2012, 10:07 AM

## 2012-04-04 NOTE — Plan of Care (Signed)
Problem: Phase I Progression Outcomes Goal: Initial discharge plan identified Outcome: Completed/Met Date Met:  04/04/12 Return home with boyfriend when medically cleared.

## 2012-04-05 LAB — BASIC METABOLIC PANEL
BUN: 5 mg/dL — ABNORMAL LOW (ref 6–23)
CO2: 28 mEq/L (ref 19–32)
Chloride: 108 mEq/L (ref 96–112)
Glucose, Bld: 96 mg/dL (ref 70–99)
Potassium: 3.5 mEq/L (ref 3.5–5.1)

## 2012-04-05 MED ORDER — GABAPENTIN 300 MG PO CAPS: 600.0000 mg | ORAL_CAPSULE | Freq: Four times a day (QID) | ORAL | Status: DC

## 2012-04-05 MED ORDER — DIAZEPAM 10 MG PO TABS: 10.0000 mg | ORAL_TABLET | Freq: Three times a day (TID) | ORAL | Status: AC | PRN

## 2012-04-05 MED ORDER — DIAZEPAM 10 MG PO TABS: 10.0000 mg | ORAL_TABLET | Freq: Three times a day (TID) | ORAL | Status: DC | PRN

## 2012-04-05 MED ORDER — TRAZODONE HCL 100 MG PO TABS: 200.0000 mg | ORAL_TABLET | Freq: Every day | ORAL | Status: DC

## 2012-04-05 NOTE — Progress Notes (Signed)
   CARE MANAGEMENT NOTE 04/05/2012  Patient:  Patricia [name], Patricia [name]   Account Number:  [id number]  Date Initiated:  04/03/2012  Documentation initiated by:  Letha Cape  Subjective/Objective Assessment:   dx n/v  admit- lives with boyfriend. pta independent.  Patient was at [facility] on previous admission.     Action/Plan:   Anticipated DC Date:  04/05/2012   Anticipated DC Plan:  HOME/SELF CARE      DC Planning Services  CM consult  Medication Assistance      Choice offered to / List presented to:             Status of service:  Completed, signed off Medicare Important Message given?   (If response is "NO", the following Medicare IM given date fields will be blank) Date Medicare IM given:   Date Additional Medicare IM given:    Discharge Disposition:    Per UR Regulation:  Reviewed for med. necessity/level of care/duration of stay  If discussed at Long Length of Stay Meetings, dates discussed:    Comments:  04/05/2012 1200 Spoke to pt and states she goes to [facility] in [place]. Sees Dr Janae Sauce at Clinic. She pays $17 for Lexapro for 3 month supply. Currently she does not have any Neurontin at home. She is not working and does not have any insurance coverage. Contacted main pharmacy and pt is eligible for ZZ fund. Will get 3 day supply of Neurontin. Explained to pt that fund can be used once per year. Isidoro Donning RN CCM Case Mgmt phone [phone]  04/03/12 17:49 Letha Cape RN, BSN [phone] patient lives with boyfriend, pta independent.  Patient will need medication ast at dc, she is eligble. Patient has transportation.  NCM will continue to follow for dc needs.

## 2012-04-05 NOTE — Progress Notes (Signed)
Patricia [name] discharged Home per MD order.  Discharge instructions reviewed and discussed with the patient, all questions and concerns answered. Copy of instructions and scripts given to patient. Case manager arranged medication assistance and meds  from pharmacy given to the patient.  Katie, Faraone  Home Medication Instructions [id number]   Printed on:04/05/12 1345  Medication Information                    escitalopram (LEXAPRO) 20 MG tablet Take 1 tablet (20 mg total) by mouth daily. For depression.           Iloperidone (FANAPT) 2 MG TABS Take 1 tablet (2 mg total) by mouth 2 (two) times daily. For mood control and psychosis.           traZODone (DESYREL) 100 MG tablet Take 2 tablets (200 mg total) by mouth at bedtime.           gabapentin (NEURONTIN) 300 MG capsule Take 2 capsules (600 mg total) by mouth 4 (four) times daily. For anxiety           diazepam (VALIUM) 10 MG tablet Take 1 tablet (10 mg total) by mouth every 8 (eight) hours as needed for anxiety.             Patients skin is clean, [name] and intact, no evidence of skin break down. IV site discontinued and catheter remains intact. Site without signs and symptoms of complications. Dressing and pressure applied.  Patient eambulated to car with NT and boyfriend, no distress noted upon discharge.  Julien Nordmann [facility] 04/05/2012 1:45 PM

## 2012-04-05 NOTE — Discharge Summary (Signed)
PATIENT DETAILS Name: Patricia [name] Age: 36 y.o. Sex: female Date of Birth: [birth year] MRN: [id number]. Admit Date: 04/03/2012 Admitting Physician: Patricia Cove, MD PCP:No primary provider on file.  Recommendations for Outpatient Follow-up:  1. Follow up with primary Psychiatrist at next appointment   PRIMARY DISCHARGE DIAGNOSIS:  Active Problems:  Nausea and vomiting  Leukocytosis  Abdominal pain      PAST MEDICAL HISTORY: Past Medical History  Diagnosis Date  . Depression   . Schizoaffective disorder, bipolar type     DISCHARGE MEDICATIONS: Medication List  As of 04/05/2012  9:51 AM   TAKE these medications         diazepam 10 MG tablet   Commonly known as: VALIUM   Take 1 tablet (10 mg total) by mouth every 8 (eight) hours as needed for anxiety.      escitalopram 20 MG tablet   Commonly known as: LEXAPRO   Take 1 tablet (20 mg total) by mouth daily. For depression.      gabapentin 300 MG capsule   Commonly known as: NEURONTIN   Take 2 capsules (600 mg total) by mouth 4 (four) times daily. For anxiety      Iloperidone 2 MG Tabs   Take 1 tablet (2 mg total) by mouth 2 (two) times daily. For mood control and psychosis.      traZODone 100 MG tablet   Commonly known as: DESYREL   Take 2 tablets (200 mg total) by mouth at bedtime.             BRIEF HPI:  See H&P, Labs, Consult and Test reports for all details in brief, patient was admitted for persistent nausea with vomiting and abdominal pain.  CONSULTATIONS:   psychiatry  PERTINENT RADIOLOGIC STUDIES: Ct Abdomen Pelvis Wo Contrast  04/03/2012  *RADIOLOGY REPORT*  Clinical Data: Abdominal pain, nausea and vomiting.  CT ABDOMEN AND PELVIS WITHOUT CONTRAST  Technique:  Multidetector CT imaging of the abdomen and pelvis was performed following the standard protocol without intravenous contrast.  Comparison: None.  Findings: The lung bases are clear.  Small esophageal hiatal hernia.  Diffuse fatty infiltration  of the liver.  Surgical absence of the gallbladder.  The unenhanced appearance of the spleen, pancreas, adrenal glands, kidneys, abdominal aorta, and retroperitoneal lymph nodes is unremarkable.  Stomach, small bowel, and colon are mostly decompressed.  No free fluid or free air in the abdomen.  Pelvis:  The bladder is incompletely distended.  The uterus and adnexal structures do not appear enlarged.  No free or loculated pelvic fluid collections.  No evidence of diverticulitis.  The appendix is not visualized.  Normal alignment of the lumbar vertebrae.  IMPRESSION: Diffuse fatty infiltration of the liver. Small esophageal hiatal hernia.  Surgical absence of the gallbladder.  The unenhanced abdomen and pelvis is otherwise unremarkable.  Original Report Authenticated By: Patricia [name], M.D.   Ct Head Wo Contrast  04/03/2012  *RADIOLOGY REPORT*  Clinical Data: Abdominal pain, nausea and vomiting.  CT HEAD WITHOUT CONTRAST  Technique:  Contiguous axial images were obtained from the base of the skull through the vertex without contrast.  Comparison: 05/30/2010  Findings: Mild cerebral atrophy for patient's age.  No mass effect or midline shift.  No abnormal extra-axial fluid collections.  Patricia [name]- white matter junctions are distinct.  Basal cisterns are not effaced.  No acute intracranial hemorrhage.  No depressed skull fractures.  Visualized paranasal sinuses and mastoid air cells are not opacified.  No significant change since  previous study.  IMPRESSION: No acute intracranial abnormalities.  Original Report Authenticated By: Patricia [name], M.D.     PERTINENT LAB RESULTS: CBC:  Basename 04/04/12 1000 04/03/12 1351  WBC 9.0 19.3*  HGB 11.9* 15.1*  HCT 36.2 43.5  PLT 172 249   CMET CMP     Component Value Date/Time   NA 143 04/05/2012 0615   K 3.5 04/05/2012 0615   CL 108 04/05/2012 0615   CO2 28 04/05/2012 0615   GLUCOSE 96 04/05/2012 0615   BUN 5* 04/05/2012 0615   CREATININE 0.81 04/05/2012  0615   CALCIUM 8.5 04/05/2012 0615   PROT 5.5* 04/04/2012 1000   ALBUMIN 3.1* 04/04/2012 1000   AST 22 04/04/2012 1000   ALT 27 04/04/2012 1000   ALKPHOS 47 04/04/2012 1000   BILITOT 0.4 04/04/2012 1000   GFRNONAA >90 04/05/2012 0615   GFRAA >90 04/05/2012 0615    GFR Estimated Creatinine Clearance: 114.8 ml/min (by C-G formula based on Cr of 0.81).  Basename 04/04/12 1000 04/03/12 0300  LIPASE 21 15  AMYLASE -- --   No results found for this basename: CKTOTAL:3,CKMB:3,CKMBINDEX:3,TROPONINI:3 in the last 72 hours No components found with this basename: POCBNP:3 No results found for this basename: DDIMER:2 in the last 72 hours No results found for this basename: HGBA1C:2 in the last 72 hours No results found for this basename: CHOL:2,HDL:2,LDLCALC:2,TRIG:2,CHOLHDL:2,LDLDIRECT:2 in the last 72 hours No results found for this basename: TSH,T4TOTAL,FREET3,T3FREE,THYROIDAB in the last 72 hours No results found for this basename: VITAMINB12:2,FOLATE:2,FERRITIN:2,TIBC:2,IRON:2,RETICCTPCT:2 in the last 72 hours Coags: No results found for this basename: PT:2,INR:2 in the last 72 hours Microbiology: No results found for this or any previous visit (from the past 240 hour(s)).   BRIEF HOSPITAL COURSE:   Active Problems: Nausea and vomiting  -likely Gastroenteritis-?viral  -Lipase normal on admission -patient was admitted, kept NPO, given IVF and anti emetics, CT abdomen did not reveal acute abnormalities, diet was slowly advanced, she has now tolerated a regular diet, and is eager to be discharged home today   Leukocytosis -likely reactive to above -not started on any antibiotics -was afebrile, and has now resolved   Abdominal pain -likely 2/2 to gastroenteritis -has resolved  Bipolar and schizoaffective disorder  -On admission-was agitated-required haldol -however since then has been very calm and pleasant -see by psych-to continue with current meds  TODAY-DAY OF  DISCHARGE:  Subjective:   Patricia [name] today has no headache,no chest abdominal pain,no new weakness tingling or numbness, feels much better wants to go home today.   Objective:   Blood pressure 95/60, pulse 79, temperature 98.2 F (36.8 C), temperature source Oral, resp. rate 16, height 5\' 8"  (1.727 m), weight 91.627 kg (202 lb), last menstrual period 03/19/2012, SpO2 93.00%.  Intake/Output Summary (Last 24 hours) at 04/05/12 0951 Last data filed at 04/05/12 0600  Gross per 24 hour  Intake 2504.17 ml  Output      0 ml  Net 2504.17 ml    Exam Awake Alert, Oriented *3, No new F.N deficits, Normal affect [place].AT,PERRAL Supple Neck,No JVD, No cervical lymphadenopathy appriciated.  Symmetrical Chest wall movement, Good air movement bilaterally, CTAB RRR,No Gallops,Rubs or new Murmurs, No Parasternal Heave +ve B.Sounds, Abd Soft, Non tender, No organomegaly appriciated, No rebound -guarding or rigidity. No Cyanosis, Clubbing or edema, No new Rash or bruise  DISCHARGE CONDITION: Stable  DISPOSITION:  HOME  DISCHARGE INSTRUCTIONS:    Activity:  As tolerated with Full fall precautions use walker/cane & assistance  as needed  Diet recommendation: Regular Diet   Follow-up Information    Please follow up. (keep next appointment on Aug 13th with Psychiatry)          Total Time spent on discharge equals 45 minutes.  SignedJeoffrey Massed 04/05/2012 9:51 AM

## 2012-04-05 NOTE — Progress Notes (Signed)
PATIENT DETAILS Name: Patricia [name] Age: 36 y.o. Sex: female Date of Birth: 07/18/1976 Admit Date: 04/03/2012 Admitting Physician Zannie Cove, MD PCP:No primary provider on file.  Subjective: Tolerating full liquids with no further nausea or vomiting  Assessment/Plan: Active Problems:  Nausea and vomiting -etiology is not very evident at present-abdomen is soft and very benign appearing. Last BM yesterday pm as well.? Gastroenteritis-?viral -Lipase normal on admission - advance diet to regular diet this am-if tolerates- d/c home -continue with supportive care in interim   Leukocytosis -likely reactive-resolved -non toxic looking and afebrile -will recheck CBC once PICC line in place  Hypokalemia -resolved -2/2 to GI loss   Abdominal pain -seems to have resolved this am -abd is soft and benign appearing --CT Abdomen/Pelvis-negative-done 7/19  Bipolar and schizoaffective disorder  -today seems to be very pleasant and calm, yesterday she was very agitated and did require haldol -continue with psych meds -appreciate psych input  Disposition: Remain inpatient-home later today  DVT Prophylaxis: Prophylactic Lovenox  Code Status: Full code   Procedures:  None  CONSULTS:  None  PHYSICAL EXAM: Vital signs in last 24 hours: Filed Vitals:   04/04/12 0549 04/04/12 1500 04/04/12 2119 04/05/12 0456  BP: 111/69 116/77 98/59 95/60   Pulse: 94 82 90 79  Temp: 98.2 F (36.8 C) 97.5 F (36.4 C) 98.5 F (36.9 C) 98.2 F (36.8 C)  TempSrc: Oral Oral Oral Oral  Resp: 20 18 16 16   Height:      Weight:      SpO2: 92% 98% 94% 93%    Weight change:  Body mass index is 30.71 kg/(m^2).   Gen Exam: Awake and alert with clear speech.   Neck: Supple, No JVD.   Chest: B/L Clear.   CVS: S1 S2 Regular, no murmurs.  Abdomen: soft, BS +, non tender, non distended.  Extremities: no edema, lower extremities warm to touch. Neurologic: Non Focal.  Skin: No Rash.     Wounds: N/A.    Intake/Output from previous day:  Intake/Output Summary (Last 24 hours) at 04/05/12 0925 Last data filed at 04/05/12 0600  Gross per 24 hour  Intake 2504.17 ml  Output      0 ml  Net 2504.17 ml     LAB RESULTS: CBC  Lab 04/04/12 1000 04/03/12 1351 04/03/12 0300  WBC 9.0 19.3* 25.6*  HGB 11.9* 15.1* 16.8*  HCT 36.2 43.5 48.9*  PLT 172 249 287  MCV 88.7 86.5 87.8  MCH 29.2 30.0 30.2  MCHC 32.9 34.7 34.4  RDW 13.3 13.3 13.0  LYMPHSABS -- -- --  MONOABS -- -- --  EOSABS -- -- --  BASOSABS -- -- --  BANDABS -- -- --    Chemistries   Lab 04/05/12 0615 04/04/12 1000 04/03/12 1351 04/03/12 0300  NA 143 142 146* 139  K 3.5 2.5* 4.5 3.7  CL 108 107 107 97  CO2 28 25 18* 20  GLUCOSE 96 93 115* 126*  BUN 5* 7 12 15   CREATININE 0.81 0.74 0.88 1.29*  CALCIUM 8.5 7.6* 9.4 11.0*  MG -- -- -- --    CBG: No results found for this basename: GLUCAP:5 in the last 168 hours  GFR Estimated Creatinine Clearance: 114.8 ml/min (by C-G formula based on Cr of 0.81).  Coagulation profile No results found for this basename: INR:5,PROTIME:5 in the last 168 hours  Cardiac Enzymes No results found for this basename: CK:3,CKMB:3,TROPONINI:3,MYOGLOBIN:3 in the last 168 hours  No components found with this basename:  POCBNP:3 No results found for this basename: DDIMER:2 in the last 72 hours No results found for this basename: HGBA1C:2 in the last 72 hours No results found for this basename: CHOL:2,HDL:2,LDLCALC:2,TRIG:2,CHOLHDL:2,LDLDIRECT:2 in the last 72 hours No results found for this basename: TSH,T4TOTAL,FREET3,T3FREE,THYROIDAB in the last 72 hours No results found for this basename: VITAMINB12:2,FOLATE:2,FERRITIN:2,TIBC:2,IRON:2,RETICCTPCT:2 in the last 72 hours  Basename 04/04/12 1000 04/03/12 0300  LIPASE 21 15  AMYLASE -- --    Urine Studies No results found for this basename:  UACOL:2,UAPR:2,USPG:2,UPH:2,UTP:2,UGL:2,UKET:2,UBIL:2,UHGB:2,UNIT:2,UROB:2,ULEU:2,UEPI:2,UWBC:2,URBC:2,UBAC:2,CAST:2,CRYS:2,UCOM:2,BILUA:2 in the last 72 hours  MICROBIOLOGY: No results found for this or any previous visit (from the past 240 hour(s)).  RADIOLOGY STUDIES/RESULTS: Ct Abdomen Pelvis Wo Contrast  04/03/2012  *RADIOLOGY REPORT*  Clinical Data: Abdominal pain, nausea and vomiting.  CT ABDOMEN AND PELVIS WITHOUT CONTRAST  Technique:  Multidetector CT imaging of the abdomen and pelvis was performed following the standard protocol without intravenous contrast.  Comparison: None.  Findings: The lung bases are clear.  Small esophageal hiatal hernia.  Diffuse fatty infiltration of the liver.  Surgical absence of the gallbladder.  The unenhanced appearance of the spleen, pancreas, adrenal glands, kidneys, abdominal aorta, and retroperitoneal lymph nodes is unremarkable.  Stomach, small bowel, and colon are mostly decompressed.  No free fluid or free air in the abdomen.  Pelvis:  The bladder is incompletely distended.  The uterus and adnexal structures do not appear enlarged.  No free or loculated pelvic fluid collections.  No evidence of diverticulitis.  The appendix is not visualized.  Normal alignment of the lumbar vertebrae.  IMPRESSION: Diffuse fatty infiltration of the liver. Small esophageal hiatal hernia.  Surgical absence of the gallbladder.  The unenhanced abdomen and pelvis is otherwise unremarkable.  Original Report Authenticated By: Marlon Pel, M.D.   Ct Head Wo Contrast  04/03/2012  *RADIOLOGY REPORT*  Clinical Data: Abdominal pain, nausea and vomiting.  CT HEAD WITHOUT CONTRAST  Technique:  Contiguous axial images were obtained from the base of the skull through the vertex without contrast.  Comparison: 05/30/2010  Findings: Mild cerebral atrophy for patient's age.  No mass effect or midline shift.  No abnormal extra-axial fluid collections.  Wallace Cullens- white matter junctions are  distinct.  Basal cisterns are not effaced.  No acute intracranial hemorrhage.  No depressed skull fractures.  Visualized paranasal sinuses and mastoid air cells are not opacified.  No significant change since previous study.  IMPRESSION: No acute intracranial abnormalities.  Original Report Authenticated By: Marlon Pel, M.D.    MEDICATIONS: Scheduled Meds:    . enoxaparin (LOVENOX) injection  40 mg Subcutaneous Q24H  . escitalopram  20 mg Oral Daily  . gabapentin  600 mg Oral QID  . Iloperidone  2 mg Oral BID  . pantoprazole (PROTONIX) IV  40 mg Intravenous Q12H  . potassium chloride  10 mEq Intravenous Q1 Hr x 4  . traZODone  200 mg Oral QHS  . DISCONTD: enoxaparin (LOVENOX) injection  30 mg Subcutaneous Q24H   Continuous Infusions:    . sodium chloride 75 mL/hr at 04/04/12 2212   PRN Meds:.acetaminophen, acetaminophen, diazepam, haloperidol lactate, HYDROmorphone (DILAUDID) injection, ondansetron (ZOFRAN) IV, ondansetron, sodium chloride, DISCONTD: haloperidol lactate  Antibiotics: Anti-infectives    None       Jeoffrey Massed, MD  [facility] Pager:[phone]  If 7PM-7AM, please contact night-coverage [email or web address] Password [email or web address] 04/05/2012, 9:25 AM   LOS: 2 days

## 2012-04-22 ENCOUNTER — Inpatient Hospital Stay (HOSPITAL_COMMUNITY)
Admission: AD | Admit: 2012-04-22 | Discharge: 2012-04-23 | Disposition: A | Discharge status: Home / Self Care | Payer: Self-pay | Source: Ambulatory Visit | Attending: Obstetrics and Gynecology | Admitting: Obstetrics and Gynecology

## 2012-04-22 DIAGNOSIS — R1032 Left lower quadrant pain: Secondary | ICD-10-CM | POA: Insufficient documentation

## 2012-04-22 DIAGNOSIS — E876 Hypokalemia: Secondary | ICD-10-CM | POA: Insufficient documentation

## 2012-04-22 DIAGNOSIS — N83209 Unspecified ovarian cyst, unspecified side: Secondary | ICD-10-CM | POA: Insufficient documentation

## 2012-04-22 NOTE — MAU Note (Signed)
Pt reports two hours ago she started having "severe pain" in lower abd and right side. 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

## 2012-04-22 NOTE — MAU Provider Note (Signed)
History     CSN: [id number]  Arrival date & time 04/22/12  2301   First Provider Initiated Contact with Patient 04/23/12 [phone]      Chief Complaint  Patient presents with  . Abdominal Pain   HPI Pt is a 36 year old gravida 5 para 2122 who reports sudden onset of severe pain in her lower quadrant radiating to left lower quadrant at 10:30 PM accompanied by nausea and vomiting. She was diagnosed with a right hemorrhagic ovarian cyst in June of this year and had a laparoscopic cystectomy in 2003. 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 She denies fever, chills, diarrhea, constipation, vaginal discharge, vaginal bleeding, urinary symptoms. She has been in a mutually monogamous relationship for 3 years and is only rarely sexually active.   Past Medical History  Diagnosis Date  . Depression   . Schizoaffective disorder, bipolar type     Past Surgical History  Procedure Date  . Tonsillectomy and adenoidectomy     "when I was a child"  . Appendectomy     "I was a child"  . Cholecystectomy 2001  . Ovarian cyst removal ~ 2003    laparoscopically; left    Family History  Problem Relation Age of Onset  . Diabetes Mother   . Hypertension Mother   . Hypertension Father     History  Substance Use Topics  . Smoking status: Never Smoker   . Smokeless tobacco: Never Used  . Alcohol Use: Yes     04/03/12 "last alcohol ~ 2008"    OB History    Grav Para Term Preterm Abortions TAB SAB Ect Mult Living   5 3 2 1 2 1 1  0 0       Review of SystemsPertinent findings in HPI.  Allergies  Ativan; Benzodiazepines; Penicillins; Rocephin; and Zofran  Home Medications   Medications Prior to Admission  Medication Sig Dispense Refill  . diazepam (VALIUM) 10 MG tablet Take 10 mg by mouth 3 (three) times daily.      Marland Kitchen escitalopram (LEXAPRO) 20 MG tablet Take 1 tablet (20 mg total) by mouth  daily. For depression.  30 tablet  0  . gabapentin (NEURONTIN) 300 MG capsule Take 2 capsules (600 mg total) by mouth 4 (four) times daily. For anxiety  12 capsule  0  . hydrochlorothiazide (HYDRODIURIL) 25 MG tablet Take 25 mg by mouth daily as needed.      Marland Kitchen ibuprofen (ADVIL,MOTRIN) 800 MG tablet Take 800 mg by mouth 3 (three) times daily as needed.      . Iloperidone (FANAPT) 2 MG TABS Take 1 tablet (2 mg total) by mouth 2 (two) times daily. For mood control and psychosis.  60 tablet  0  . traZODone (DESYREL) 100 MG tablet Take 2 tablets (200 mg total) by mouth at bedtime.        LMP 04/15/2012  Physical Exam  Constitutional: She appears well-developed and well-nourished. Distressed: severely.  HENT:  Head: Normocephalic.  Eyes: Conjunctivae are normal.  Pulmonary/Chest: Effort normal.  Abdominal: Soft. Bowel sounds are normal. She exhibits no distension and no mass. There is tenderness in the right upper quadrant and right lower quadrant. There is guarding. There is no rigidity, no rebound, no CVA tenderness and no tenderness at McBurney's point. No hernia.  Skin: She is not diaphoretic.    ED Course  Procedures (including critical care time)  Labs Results for orders placed during the hospital encounter of 04/22/12 (from the past 24 hour(s))  URINALYSIS, ROUTINE W REFLEX MICROSCOPIC     Status: Abnormal   Collection Time   04/22/12 11:30 PM      Component Value Range   Color, Urine YELLOW  YELLOW   APPearance CLEAR  CLEAR   Specific Gravity, Urine <1.005 (*) 1.005 - 1.030   pH 5.5  5.0 - 8.0   Glucose, UA NEGATIVE  NEGATIVE mg/dL   Hgb urine dipstick NEGATIVE  NEGATIVE   Bilirubin Urine NEGATIVE  NEGATIVE   Ketones, ur NEGATIVE  NEGATIVE mg/dL   Protein, ur NEGATIVE  NEGATIVE mg/dL   Urobilinogen, UA 0.2  0.0 - 1.0 mg/dL   Nitrite NEGATIVE  NEGATIVE   Leukocytes, UA SMALL (*) NEGATIVE  URINE MICROSCOPIC-ADD ON     Status: Abnormal   Collection Time   04/22/12 11:30 PM       Component Value Range   Squamous Epithelial / LPF FEW (*) RARE   WBC, UA 7-10  <3 WBC/hpf   RBC / HPF 3-6  <3 RBC/hpf   Bacteria, UA FEW (*) RARE   Urine-Other AMORPHOUS URATES/PHOSPHATES    POCT PREGNANCY, URINE     Status: Normal   Collection Time   04/22/12 11:35 PM      Component Value Range   Preg Test, Ur NEGATIVE  NEGATIVE  CBC     Status: Normal   Collection Time   04/23/12 12:30 AM      Component Value Range   WBC 8.6  4.0 - 10.5 K/uL   RBC 4.42  3.87 - 5.11 MIL/uL   Hemoglobin 13.0  12.0 - 15.0 g/dL   HCT 16.1  09.6 - 04.5 %   MCV 88.9  78.0 - 100.0 fL   MCH 29.4  26.0 - 34.0 pg   MCHC 33.1  30.0 - 36.0 g/dL   RDW 40.9  81.1 - 91.4 %   Platelets 252  150 - 400 K/uL  COMPREHENSIVE METABOLIC PANEL     Status: Abnormal   Collection Time   04/23/12 12:30 AM      Component Value Range   Sodium 132 (*) 135 - 145 mEq/L   Potassium 2.9 (*) 3.5 - 5.1 mEq/L   Chloride 94 (*) 96 - 112 mEq/L   CO2 25  19 - 32 mEq/L   Glucose, Bld 93  70 - 99 mg/dL   BUN 20  6 - 23 mg/dL   Creatinine, Ser 7.82 (*) 0.50 - 1.10 mg/dL   Calcium 9.2  8.4 - 95.6 mg/dL   Total Protein 6.6  6.0 - 8.3 g/dL   Albumin 3.9  3.5 - 5.2 g/dL   AST 24  0 - 37 U/L   ALT 33  0 - 35 U/L   Alkaline Phosphatase 69  39 - 117 U/L   Total Bilirubin 0.2 (*) 0.3 - 1.2 mg/dL   GFR calc non Af Amer 64 (*) >90 mL/min   GFR calc Af Amer 74 (*) >90 mL/min   Imaging US Transvaginal Non-ob  04/23/2012  *RADIOLOGY REPORT*  Clinical Data: Right lower quadrant pain, history of right hemorrhagic ovarian cyst.  TRANSVAGINAL ULTRASOUND OF PELVIS  Technique:  Transvaginal ultrasound examination of the pelvis was performed including evaluation of the uterus, ovaries, adnexal regions, and pelvic cul-de-sac.  Comparison:  04/03/2012, 02/17/2012  Findings:  Uterus:  Normal in size and appearance.  Uterus measures 9.4 x 4.5 x 5.7  cm.  No focal contour abnormality or fibroid.  Cervical Nabothian cyst noted.  Endometrium: Normal thickness  and appearance.  Five point mm AP thickness.  Right ovary: Measures 3.7 x 2.4 x 1.8 cm.  Right ovary contains a focal hypoechoic area measuring 1.2 cm in greatest diameter, nonspecific but could represent a small collapsing hemorrhagic cyst or follicle.  This is significantly smaller than the 02/17/2012 comparison.  Left ovary: Measures 5.1 x 3.5 x 2.6 cm.  Left ovary contains a complex hemorrhagic debris filled cyst measuring 3.0 x 2.3 x 2 cm, new since 02/17/2012.  Other Findings:  No free fluid  IMPRESSION: Resolving right complex hemorrhagic cyst.  Interval development of a left complex hemorrhagic 3 cm cyst  No free fluid  Normal uterus and endometrium  Original Report Authenticated By: Judie Petit. Ruel Favors, M.D.   ED course Vomited several times so far this visit although not observed by RN or CNM. Phenergan and Toradol given. No pain relief. Rocking and crying with pain. Nausea and vomiting continue. We'll start IV for pain medicine and potassium administration. Nausea and vomiting relieved. Multiple RNs unable to start IV. We'll try PO medications since it does not appear that the patient has a surgical abdomen. Pain resolved.  Assessment 1. Hemorrhagic ovarian cyst   2. Hypokalemia    Plan Discharge home per consult with Dr. Emelda Fear Follow-up as soon as possible with primary care provider regarding 2 documented episodes of hypokalemia while on HCTZ. We'll continuously cycle Sprintec for cyst prevention. Discussed risk of blood clots while on birth control pills and strongly urged patient to avoid smoking to decrease those risks. Medication List  As of 04/23/2012  5:13 AM   START taking these medications         diclofenac 75 MG EC tablet   Commonly known as: VOLTAREN   Take 1 tablet (75 mg total) by mouth 2 (two) times daily.      norgestimate-ethinyl estradiol 0.25-35 MG-MCG tablet   Commonly known as: ORTHO-CYCLEN,SPRINTEC,PREVIFEM   Take 1 tablet by mouth daily. Skip last 7 pill of  every pack and immediate;y start new pack.      potassium chloride 10 MEQ tablet   Commonly known as: K-DUR   Take 2 tablets (20 mEq total) by mouth daily.      promethazine 25 MG tablet   Commonly known as: PHENERGAN   Take 1 tablet (25 mg total) by mouth every 6 (six) hours as needed for nausea.         CONTINUE taking these medications         diazepam 10 MG tablet   Commonly known as: VALIUM      escitalopram 20 MG tablet   Commonly known as: LEXAPRO   Take 1 tablet (20 mg total) by mouth daily. For depression.      gabapentin 300 MG capsule   Commonly known as: NEURONTIN   Take 2 capsules (600 mg total) by mouth 4 (four) times daily. For anxiety      hydrochlorothiazide 25 MG tablet   Commonly known as: HYDRODIURIL      Iloperidone 2 MG Tabs   Take 1 tablet (2 mg total) by mouth 2 (two) times daily. For mood control and psychosis.      traZODone 100 MG tablet   Commonly known as: DESYREL   Take 2 tablets (200 mg total) by mouth at bedtime.         STOP taking these medications  ibuprofen 800 MG tablet          Where to get your medications    These are the prescriptions that you need to pick up.   You may get these medications from any pharmacy.         diclofenac 75 MG EC tablet   norgestimate-ethinyl estradiol 0.25-35 MG-MCG tablet   potassium chloride 10 MEQ tablet   promethazine 25 MG tablet           Follow-up Information    Follow up with [facility] in 2 months. ( for ongoing management of cysts. Will schedule followup ultrasound)    Contact information:   [address] [place] [address]         [place], [place] 04/23/2012 5:13 AM

## 2012-04-23 ENCOUNTER — Encounter (HOSPITAL_COMMUNITY): Payer: Self-pay | Admitting: *Deleted

## 2012-04-23 ENCOUNTER — Inpatient Hospital Stay (HOSPITAL_COMMUNITY): Payer: Self-pay

## 2012-04-23 LAB — RAPID URINE DRUG SCREEN, HOSP PERFORMED
Amphetamines: NOT DETECTED
Barbiturates: NOT DETECTED
Benzodiazepines: POSITIVE — AB
Cocaine: NOT DETECTED

## 2012-04-23 LAB — URINALYSIS, ROUTINE W REFLEX MICROSCOPIC
Bilirubin Urine: NEGATIVE
Hgb urine dipstick: NEGATIVE
Specific Gravity, Urine: <1.005 — ABNORMAL LOW (ref 1.005–1.030)
pH: 5.5 (ref 5.0–8.0)

## 2012-04-23 LAB — CBC
MCH: 29.4 pg (ref 26.0–34.0)
MCHC: 33.1 g/dL (ref 30.0–36.0)
Platelets: 252 10*3/uL (ref 150–400)
RBC: 4.42 MIL/uL (ref 3.87–5.11)
RDW: 13 % (ref 11.5–15.5)

## 2012-04-23 LAB — COMPREHENSIVE METABOLIC PANEL
ALT: 33 U/L (ref 0–35)
AST: 24 U/L (ref 0–37)
Albumin: 3.9 g/dL (ref 3.5–5.2)
Calcium: 9.2 mg/dL (ref 8.4–10.5)
Creatinine, Ser: 1.11 mg/dL — ABNORMAL HIGH (ref 0.50–1.10)
Sodium: 132 mEq/L — ABNORMAL LOW (ref 135–145)
Total Protein: 6.6 g/dL (ref 6.0–8.3)

## 2012-04-23 LAB — URINE MICROSCOPIC-ADD ON

## 2012-04-23 MED ORDER — KETOROLAC TROMETHAMINE 60 MG/2ML IM SOLN
60.0000 mg | Freq: Once | INTRAMUSCULAR | Status: AC
  Administered 2012-04-23: 60 mg via INTRAMUSCULAR
  Filled 2012-04-23: qty 2

## 2012-04-23 MED ORDER — LACTATED RINGERS IV SOLN: INTRAVENOUS | Status: DC

## 2012-04-23 MED ORDER — DICLOFENAC SODIUM 75 MG PO TBEC: 75.0000 mg | DELAYED_RELEASE_TABLET | Freq: Two times a day (BID) | ORAL | Status: DC

## 2012-04-23 MED ORDER — NORGESTIMATE-ETH ESTRADIOL 0.25-35 MG-MCG PO TABS: 1.0000 | ORAL_TABLET | Freq: Every day | ORAL | Status: DC

## 2012-04-23 MED ORDER — POTASSIUM CHLORIDE ER 10 MEQ PO TBCR: 20.0000 meq | EXTENDED_RELEASE_TABLET | Freq: Every day | ORAL | Status: DC

## 2012-04-23 MED ORDER — PROMETHAZINE HCL 25 MG/ML IJ SOLN
25.0000 mg | Freq: Once | INTRAMUSCULAR | Status: AC
  Administered 2012-04-23: 25 mg via INTRAMUSCULAR
  Filled 2012-04-23: qty 1

## 2012-04-23 MED ORDER — POTASSIUM CHLORIDE CRYS ER 20 MEQ PO TBCR
20.0000 meq | EXTENDED_RELEASE_TABLET | Freq: Once | ORAL | Status: AC
  Administered 2012-04-23: 20 meq via ORAL
  Filled 2012-04-23: qty 1

## 2012-04-23 MED ORDER — HYDROMORPHONE HCL PF 1 MG/ML IJ SOLN
2.0000 mg | Freq: Once | INTRAMUSCULAR | Status: DC
  Filled 2012-04-23: qty 2

## 2012-04-23 MED ORDER — OXYCODONE-ACETAMINOPHEN 5-325 MG PO TABS
2.0000 | ORAL_TABLET | Freq: Once | ORAL | Status: AC
  Administered 2012-04-23: 2 via ORAL
  Filled 2012-04-23: qty 2

## 2012-04-23 MED ORDER — PROMETHAZINE HCL 25 MG PO TABS: 25.0000 mg | ORAL_TABLET | Freq: Four times a day (QID) | ORAL | Status: DC | PRN

## 2012-04-23 NOTE — MAU Provider Note (Signed)
Attestation of Attending Supervision of Advanced Practitioner: Evaluation and management procedures were performed by the PA/NP/CNM/OB Fellow under my supervision/collaboration. Chart reviewed and agree with management and plan.  Marjon Doxtater V 04/23/2012 5:51 AM    

## 2012-04-24 ENCOUNTER — Encounter: Payer: Self-pay | Admitting: Obstetrics & Gynecology

## 2012-05-10 ENCOUNTER — Emergency Department (HOSPITAL_COMMUNITY)
Admission: EM | Admit: 2012-05-10 | Discharge: 2012-05-11 | Disposition: A | Discharge status: Other Acute Inpatient Hospital | Payer: Self-pay | Attending: Emergency Medicine | Admitting: Emergency Medicine

## 2012-05-10 ENCOUNTER — Encounter (HOSPITAL_COMMUNITY): Payer: Self-pay | Admitting: *Deleted

## 2012-05-10 DIAGNOSIS — F309 Manic episode, unspecified: Secondary | ICD-10-CM | POA: Insufficient documentation

## 2012-05-10 DIAGNOSIS — Z79899 Other long term (current) drug therapy: Secondary | ICD-10-CM | POA: Insufficient documentation

## 2012-05-10 DIAGNOSIS — Z9089 Acquired absence of other organs: Secondary | ICD-10-CM | POA: Insufficient documentation

## 2012-05-10 LAB — CBC WITH DIFFERENTIAL/PLATELET
Basophils Absolute: 0 10*3/uL (ref 0.0–0.1)
Basophils Relative: 0 % (ref 0–1)
Eosinophils Relative: 0 % (ref 0–5)
HCT: 43 % (ref 36.0–46.0)
Hemoglobin: 14.8 g/dL (ref 12.0–15.0)
MCH: 29.8 pg (ref 26.0–34.0)
MCHC: 34.4 g/dL (ref 30.0–36.0)
MCV: 86.5 fL (ref 78.0–100.0)
Monocytes Absolute: 0.5 10*3/uL (ref 0.1–1.0)
Monocytes Relative: 4 % (ref 3–12)
Neutro Abs: 10.8 10*3/uL — ABNORMAL HIGH (ref 1.7–7.7)
RDW: 13 % (ref 11.5–15.5)

## 2012-05-10 LAB — POCT I-STAT, CHEM 8
Calcium, Ion: 1.11 mmol/L — ABNORMAL LOW (ref 1.12–1.23)
Creatinine, Ser: 1.1 mg/dL (ref 0.50–1.10)
Glucose, Bld: 132 mg/dL — ABNORMAL HIGH (ref 70–99)
HCT: 46 % (ref 36.0–46.0)
Hemoglobin: 15.6 g/dL — ABNORMAL HIGH (ref 12.0–15.0)
Potassium: 2.6 mEq/L — CL (ref 3.5–5.1)

## 2012-05-10 MED ORDER — POTASSIUM CHLORIDE CRYS ER 20 MEQ PO TBCR
40.0000 meq | EXTENDED_RELEASE_TABLET | Freq: Once | ORAL | Status: AC
  Administered 2012-05-10: 40 meq via ORAL
  Filled 2012-05-10: qty 2

## 2012-05-10 MED ORDER — ZIPRASIDONE MESYLATE 20 MG IM SOLR
20.0000 mg | Freq: Once | INTRAMUSCULAR | Status: AC
  Administered 2012-05-10: 20 mg via INTRAMUSCULAR
  Filled 2012-05-10: qty 20

## 2012-05-10 MED ORDER — DIAZEPAM 5 MG/ML IJ SOLN
5.0000 mg | Freq: Once | INTRAMUSCULAR | Status: AC
  Administered 2012-05-11: 5 mg via INTRAVENOUS
  Filled 2012-05-10: qty 2

## 2012-05-10 MED ORDER — METOCLOPRAMIDE HCL 5 MG/ML IJ SOLN
5.0000 mg | Freq: Once | INTRAMUSCULAR | Status: AC
  Administered 2012-05-10: 5 mg via INTRAMUSCULAR
  Filled 2012-05-10: qty 2

## 2012-05-10 NOTE — ED Notes (Addendum)
Pt family states that pt is in a manic stage and she has been out of medications for the past 3 days. Pt repeats medicine medicine, medicine. Pt states that she does not want to be here. Pt states feels nauseated. Pt tearful in triage. Pt was given valium and geodon to calm her the last time she was in a manic state. Pt unable to fully comprehend questions in triage about hurting herself or others or whether she is hearing voices or auditory hallucination. Pt keeps repeating Ive got to leave from here. Pt state she also has a UTI but cannot comprehend a urine sample at this time and cannot urinate at this time. Pt moving around triage room.

## 2012-05-10 NOTE — ED Notes (Signed)
House coverage aware that pt is being IVCed. Do not have a sitter at this time but will try and work something out for 11. Charge RN aware.

## 2012-05-10 NOTE — ED Provider Notes (Signed)
History     CSN: [id number]  Arrival date & time 05/10/12  2051   First MD Initiated Contact with Patient 05/10/12 2132      Chief Complaint  Patient presents with  . Medical Clearance    (Consider location/radiation/quality/duration/timing/severity/associated sxs/prior treatment) HPI Comments: Patient who is bipolar recently discharged from the hospital has been out of her medication past 3 days she is in a manic phase at this time she is nauseated and vomiting she states she may be pregnant unfortunately due to her many drug allergies I needed to give her Geodon to gain control to help her calm so she can be cooperative  The history is provided by the patient.    Past Medical History  Diagnosis Date  . Depression   . Schizoaffective disorder, bipolar type     Past Surgical History  Procedure Date  . Tonsillectomy and adenoidectomy     "when I was a child"  . Appendectomy     "I was a child"  . Cholecystectomy 2001  . Ovarian cyst removal ~ 2003    laparoscopically; left    Family History  Problem Relation Age of Onset  . Diabetes Mother   . Hypertension Mother   . Hypertension Father     History  Substance Use Topics  . Smoking status: Never Smoker   . Smokeless tobacco: Never Used  . Alcohol Use: Yes     04/03/12 "last alcohol ~ 2008"    OB History    Grav Para Term Preterm Abortions TAB SAB Ect Mult Living   5 3 2 1 2 1 1  0 0 2      Review of Systems  Unable to perform ROS: Psychiatric disorder  Psychiatric/Behavioral: Positive for agitation. The patient is hyperactive.     Allergies  Ativan; Benzodiazepines; Penicillins; Rocephin; and Zofran  Home Medications   Current Outpatient Rx  Name Route Sig Dispense Refill  . DICLOFENAC SODIUM 75 MG PO TBEC Oral Take 75 mg by mouth 2 (two) times daily.    Marland Kitchen ESCITALOPRAM OXALATE 20 MG PO TABS Oral Take 20 mg by mouth daily.    Marland Kitchen GABAPENTIN 300 MG PO CAPS Oral Take 300 mg by mouth 4 (four) times daily.     . ILOPERIDONE 4 MG PO TABS Oral Take 2 mg by mouth 2 (two) times daily.    Marland Kitchen NORGESTIMATE-ETH ESTRADIOL 0.25-35 MG-MCG PO TABS Oral Take 1 tablet by mouth daily.    Marland Kitchen POTASSIUM CHLORIDE CRYS ER 20 MEQ PO TBCR Oral Take 20 mEq by mouth daily.    Marland Kitchen PROMETHAZINE HCL 25 MG PO TABS Oral Take 25 mg by mouth every 6 (six) hours as needed. For nausea    . TRAZODONE HCL 100 MG PO TABS Oral Take 200 mg by mouth at bedtime.    Marland Kitchen DIAZEPAM 10 MG PO TABS Oral Take 10 mg by mouth 3 (three) times daily.     Marland Kitchen HYDROCHLOROTHIAZIDE 25 MG PO TABS Oral Take 25 mg by mouth daily as needed.     Marland Kitchen PROMETHAZINE HCL 25 MG PO TABS Oral Take 1 tablet (25 mg total) by mouth every 6 (six) hours as needed for nausea. 30 tablet 1  . TRAZODONE HCL 100 MG PO TABS Oral Take 2 tablets (200 mg total) by mouth at bedtime.      BP 102/56  Pulse 72  Temp 98.3 F (36.8 C) (Oral)  Resp 16  SpO2 97%  LMP 04/15/2012  Physical Exam  Constitutional: She appears well-developed and well-nourished.  HENT:  Head: Normocephalic.  Eyes: Pupils are equal, round, and reactive to light.  Neurological: She is alert.  Skin: Skin is warm and dry. No rash noted.  Psychiatric: Her affect is inappropriate. Her speech is tangential. She is is hyperactive. She expresses impulsivity. She is inattentive.    ED Course  Procedures (including critical care time)   Labs Reviewed  URINE RAPID DRUG SCREEN (HOSP PERFORMED)  CBC WITH DIFFERENTIAL  ETHANOL   No results found.   No diagnosis found.    MDM   I will IVC this patient due to her manic behavior and inability to cooperate at this time repetitively asking for medications that help her feel better she is nauseated vomiting small amounts very restless unable to follow a conversation although she does state that she may be pregnant. I did give her Geodon despite its class C. category do to her allergic reaction to Ativan and benzodiazepines and Zofran        Arman Filter,  NP 05/12/12 0503

## 2012-05-11 ENCOUNTER — Encounter (HOSPITAL_COMMUNITY): Payer: Self-pay

## 2012-05-11 ENCOUNTER — Inpatient Hospital Stay (HOSPITAL_COMMUNITY)
Admission: AD | Admit: 2012-05-11 | Discharge: 2012-05-20 | DRG: 885 | Disposition: A | Discharge status: Home / Self Care | Payer: Federal, State, Local not specified - Other | Source: Ambulatory Visit | Attending: Psychiatry | Admitting: Psychiatry

## 2012-05-11 DIAGNOSIS — N390 Urinary tract infection, site not specified: Secondary | ICD-10-CM | POA: Diagnosis present

## 2012-05-11 DIAGNOSIS — F1920 Other psychoactive substance dependence, uncomplicated: Secondary | ICD-10-CM | POA: Diagnosis present

## 2012-05-11 DIAGNOSIS — F1320 Sedative, hypnotic or anxiolytic dependence, uncomplicated: Secondary | ICD-10-CM

## 2012-05-11 DIAGNOSIS — F19939 Other psychoactive substance use, unspecified with withdrawal, unspecified: Secondary | ICD-10-CM | POA: Diagnosis present

## 2012-05-11 DIAGNOSIS — F259 Schizoaffective disorder, unspecified: Principal | ICD-10-CM | POA: Diagnosis present

## 2012-05-11 DIAGNOSIS — M79609 Pain in unspecified limb: Secondary | ICD-10-CM | POA: Diagnosis not present

## 2012-05-11 DIAGNOSIS — F1020 Alcohol dependence, uncomplicated: Secondary | ICD-10-CM | POA: Diagnosis present

## 2012-05-11 DIAGNOSIS — F1120 Opioid dependence, uncomplicated: Secondary | ICD-10-CM | POA: Diagnosis present

## 2012-05-11 DIAGNOSIS — F251 Schizoaffective disorder, depressive type: Secondary | ICD-10-CM | POA: Diagnosis present

## 2012-05-11 DIAGNOSIS — F250 Schizoaffective disorder, bipolar type: Secondary | ICD-10-CM

## 2012-05-11 LAB — URINALYSIS, ROUTINE W REFLEX MICROSCOPIC
Glucose, UA: NEGATIVE mg/dL
Nitrite: POSITIVE — AB
Specific Gravity, Urine: 1.031 — ABNORMAL HIGH (ref 1.005–1.030)
pH: 6 (ref 5.0–8.0)

## 2012-05-11 LAB — POTASSIUM: Potassium: 3.5 mEq/L (ref 3.5–5.1)

## 2012-05-11 LAB — RAPID URINE DRUG SCREEN, HOSP PERFORMED
Benzodiazepines: POSITIVE — AB
Cocaine: POSITIVE — AB
Opiates: NOT DETECTED

## 2012-05-11 LAB — URINE MICROSCOPIC-ADD ON

## 2012-05-11 MED ORDER — HYDROCHLOROTHIAZIDE 25 MG PO TABS
25.0000 mg | ORAL_TABLET | Freq: Every day | ORAL | Status: DC
  Administered 2012-05-12 – 2012-05-20 (×8): 25 mg via ORAL
  Filled 2012-05-11 (×10): qty 1

## 2012-05-11 MED ORDER — ALUM & MAG HYDROXIDE-SIMETH 200-200-20 MG/5ML PO SUSP
30.0000 mL | Freq: Once | ORAL | Status: AC
  Administered 2012-05-11: 5 mL via ORAL
  Filled 2012-05-11: qty 30

## 2012-05-11 MED ORDER — PROMETHAZINE HCL 50 MG RE SUPP
50.0000 mg | Freq: Four times a day (QID) | RECTAL | Status: DC | PRN
  Administered 2012-05-11: 50 mg via RECTAL
  Filled 2012-05-11: qty 1

## 2012-05-11 MED ORDER — MAGNESIUM HYDROXIDE 400 MG/5ML PO SUSP: 30.0000 mL | Freq: Every day | ORAL | Status: DC | PRN

## 2012-05-11 MED ORDER — TRAZODONE HCL 100 MG PO TABS
200.0000 mg | ORAL_TABLET | Freq: Every day | ORAL | Status: DC
  Administered 2012-05-11: 200 mg via ORAL
  Filled 2012-05-11: qty 2

## 2012-05-11 MED ORDER — DIAZEPAM 5 MG PO TABS
10.0000 mg | ORAL_TABLET | Freq: Three times a day (TID) | ORAL | Status: DC | PRN
  Administered 2012-05-11: 10 mg via ORAL
  Filled 2012-05-11: qty 2

## 2012-05-11 MED ORDER — TRAZODONE HCL 50 MG PO TABS
50.0000 mg | ORAL_TABLET | Freq: Every evening | ORAL | Status: DC | PRN
  Administered 2012-05-16: 50 mg via ORAL
  Filled 2012-05-11: qty 1

## 2012-05-11 MED ORDER — ESCITALOPRAM OXALATE 20 MG PO TABS
20.0000 mg | ORAL_TABLET | Freq: Every day | ORAL | Status: DC
  Administered 2012-05-11 (×2): 20 mg via ORAL
  Filled 2012-05-11 (×3): qty 1

## 2012-05-11 MED ORDER — CHLORDIAZEPOXIDE HCL 25 MG PO CAPS
25.0000 mg | ORAL_CAPSULE | Freq: Four times a day (QID) | ORAL | Status: DC | PRN
  Administered 2012-05-11 – 2012-05-12 (×3): 25 mg via ORAL
  Filled 2012-05-11 (×3): qty 1

## 2012-05-11 MED ORDER — TRAZODONE HCL 100 MG PO TABS
200.0000 mg | ORAL_TABLET | Freq: Every day | ORAL | Status: DC
  Administered 2012-05-11 – 2012-05-19 (×9): 200 mg via ORAL
  Filled 2012-05-11 (×11): qty 2

## 2012-05-11 MED ORDER — GABAPENTIN 300 MG PO CAPS
300.0000 mg | ORAL_CAPSULE | Freq: Four times a day (QID) | ORAL | Status: DC
  Administered 2012-05-11 (×4): 300 mg via ORAL
  Filled 2012-05-11 (×6): qty 1

## 2012-05-11 MED ORDER — ACETAMINOPHEN 325 MG PO TABS: 650.0000 mg | ORAL_TABLET | Freq: Four times a day (QID) | ORAL | Status: DC | PRN

## 2012-05-11 MED ORDER — DICLOFENAC SODIUM 75 MG PO TBEC
75.0000 mg | DELAYED_RELEASE_TABLET | Freq: Two times a day (BID) | ORAL | Status: DC
  Administered 2012-05-12: 75 mg via ORAL
  Filled 2012-05-11 (×5): qty 1

## 2012-05-11 MED ORDER — ACETAMINOPHEN 325 MG PO TABS
650.0000 mg | ORAL_TABLET | Freq: Four times a day (QID) | ORAL | Status: DC | PRN
  Administered 2012-05-13 – 2012-05-20 (×16): 650 mg via ORAL

## 2012-05-11 MED ORDER — ILOPERIDONE 4 MG PO TABS
2.0000 mg | ORAL_TABLET | Freq: Two times a day (BID) | ORAL | Status: DC
  Administered 2012-05-11 (×2): 2 mg via ORAL
  Filled 2012-05-11 (×3): qty 1

## 2012-05-11 MED ORDER — LEVOFLOXACIN 500 MG PO TABS
500.0000 mg | ORAL_TABLET | Freq: Every day | ORAL | Status: DC
  Administered 2012-05-12 – 2012-05-13 (×2): 500 mg via ORAL
  Filled 2012-05-11 (×3): qty 1

## 2012-05-11 MED ORDER — ACETAMINOPHEN 325 MG PO TABS
650.0000 mg | ORAL_TABLET | Freq: Once | ORAL | Status: AC
  Administered 2012-05-11: 650 mg via ORAL
  Filled 2012-05-11: qty 2

## 2012-05-11 MED ORDER — HYDROCHLOROTHIAZIDE 25 MG PO TABS
25.0000 mg | ORAL_TABLET | Freq: Every day | ORAL | Status: DC
  Administered 2012-05-11 (×2): 25 mg via ORAL
  Filled 2012-05-11 (×2): qty 1

## 2012-05-11 MED ORDER — POTASSIUM CHLORIDE CRYS ER 20 MEQ PO TBCR
20.0000 meq | EXTENDED_RELEASE_TABLET | Freq: Every day | ORAL | Status: DC
  Administered 2012-05-12 – 2012-05-20 (×9): 20 meq via ORAL
  Filled 2012-05-11 (×10): qty 1

## 2012-05-11 MED ORDER — ESCITALOPRAM OXALATE 20 MG PO TABS
20.0000 mg | ORAL_TABLET | Freq: Every day | ORAL | Status: DC
  Administered 2012-05-12 – 2012-05-20 (×9): 20 mg via ORAL
  Filled 2012-05-11 (×11): qty 1

## 2012-05-11 MED ORDER — GABAPENTIN 300 MG PO CAPS
300.0000 mg | ORAL_CAPSULE | Freq: Four times a day (QID) | ORAL | Status: DC
  Administered 2012-05-11 – 2012-05-14 (×11): 300 mg via ORAL
  Filled 2012-05-11 (×15): qty 1

## 2012-05-11 MED ORDER — ILOPERIDONE 4 MG PO TABS
2.0000 mg | ORAL_TABLET | Freq: Two times a day (BID) | ORAL | Status: DC
  Administered 2012-05-11 – 2012-05-16 (×10): 2 mg via ORAL
  Filled 2012-05-11 (×15): qty 1

## 2012-05-11 NOTE — ED Notes (Signed)
[facility] called for transport and we are waiting their call back to transport patient.

## 2012-05-11 NOTE — Progress Notes (Signed)
Involuntary admission for a 36 y.o. Female with anxious mood.   Pt. Is not a good historian at present. Pt. Reports using cocaine and  Reports that she doesn't know what happened to her after using the cocaine. Reports that she has been in the hospital 3 times this month.  Pt. Tearful throughout the admission.  Pt. Denies SI/HI and denies A/V hallucinations and contracts for safety.  Pt. Given fluids.  Support given.

## 2012-05-11 NOTE — BH Assessment (Signed)
[facility] Assessment Progress Note   This clinician was informed by Natalia Leatherwood at the assessment office that patient had been accepted to [facility] by [facility] to Dr. Allena Katz.  Room assignment 404-2.  [facility] nurse Reita Cliche informed.  Dr. Donnetta Hutching [facility]) informed.  Patient is on IVC.  This clinician did ask patient about list of person she may want on her consent to release form but patient declined to speak to clinician.  Pt will be transported to [facility] by [employer], television/film set.

## 2012-05-11 NOTE — ED Notes (Signed)
Called [facility] to transport patient to [facility]

## 2012-05-11 NOTE — ED Notes (Signed)
Pt sticking fingers in mouth and forcing emesis

## 2012-05-11 NOTE — BH Assessment (Addendum)
Assessment Note  Update:  Correction:  Per Broadus John at [facility] @ [phone], pt accepted to wait list, but they are not ready for pt.  He also stated that they needed insurance verification.  Pt was asked and reported she only has an orange card, but has disability back pay coming.  OV requires $3600 up front. Pt will not be discharged.  Udpated EDP Zammit and ED staff.  Sheriff called to cancel trasnport by ED secretary.  Pt will continue to be ran at [facility] and other facilities for possible placement.  Update:  Per last clinician on call, pt accepted to Dr. Les Pou to [facility] @ [phone].  Updated EDP Zammit and ED staff.  Updated assessment disposition, completed assessment notification and faxed to [facility] to log.  Pt is IVC and will be transported via [place].  ED staff arranging transport to [facility].     Disposition:  Disposition Disposition of Patient: Inpatient treatment program Type of inpatient treatment program: Adult (Pt accepted [facility])  On Site Evaluation by:   Reviewed with Physician:  Bonnye Fava, Rennis Harding 05/11/2012 8:30 AM

## 2012-05-11 NOTE — ED Notes (Signed)
Pt reports another episode of emesis 

## 2012-05-11 NOTE — Tx Team (Signed)
Initial Interdisciplinary Treatment Plan  PATIENT STRENGTHS: (choose at least two) Ability for insight Supportive family/friends  PATIENT STRESSORS: Substance abuse   PROBLEM LIST: Problem List/Patient Goals Date to be addressed Date deferred Reason deferred Estimated date of resolution  Schizoaffective D/O 05/11/12     Substance Abuse 05/11/12                                                DISCHARGE CRITERIA:  Improved stabilization in mood, thinking, and/or behavior Need for constant or close observation no longer present Verbal commitment to aftercare and medication compliance Withdrawal symptoms are absent or subacute and managed without 24-hour nursing intervention  PRELIMINARY DISCHARGE PLAN: Attend 12-step recovery group Return to previous living arrangement  PATIENT/FAMIILY INVOLVEMENT: This treatment plan has been presented to and reviewed with the patient, Patricia [name], and/or family member,  The patient and family have been given the opportunity to ask questions and make suggestions.  [name] 05/11/2012, 11:14 PM

## 2012-05-11 NOTE — ED Notes (Signed)
[facility] called ACT, [facility]ire, and stated they can not accept this patient. [facility] was called to cancel transport. ACT perusing another facility for this patient.

## 2012-05-11 NOTE — ED Notes (Addendum)
Pt requesting a beverage. Informed by FNP that pt can take scheduled meds now, due to pt not being on her meds last three days

## 2012-05-11 NOTE — ED Notes (Signed)
Per Dr. Adriana Simas, [facility] this patient does not have a UTI and if cleared to be transferred to treatment center.

## 2012-05-11 NOTE — BH Assessment (Signed)
Assessment Note   Patricia [name] is an 36 y.o. female. Pt brought to [facility] by family, who report pt is diagnosed with schizoaffective disorder, and is  manic and off meds for past 3 days.  ACT attempted to contact family at numbers available for more details but was unsuccessful.  Pt was extremely out of control upon coming to [facility], screaming, yelling, attempted to run out the door.  Pt placed under IVC at that point.  Pt did calm down some with medication. Upon ACT eval pt reports she missed her med appt at [facility] last week, has been off meds for 3 days.  Reports she started hearing voices and noises today, denies any command, denies visual hallucinations.  Pt denies SI/HI.  Pt very agitated still upon assessment, asking assessor to please let her go to sleep multiple times.  Pt does appear anxious and manic.  Pt denies substance use.  Axis I: Schizoaffective Disorder Axis II: Deferred Axis III:  Past Medical History  Diagnosis Date  . Depression   . Schizoaffective disorder, bipolar type    Axis IV: economic problems and problems with primary support group Axis V: 21-30 behavior considerably influenced by delusions or hallucinations OR serious impairment in judgment, communication OR inability to function in almost all areas  Past Medical History:  Past Medical History  Diagnosis Date  . Depression   . Schizoaffective disorder, bipolar type     Past Surgical History  Procedure Date  . Tonsillectomy and adenoidectomy     "when I was a child"  . Appendectomy     "I was a child"  . Cholecystectomy 2001  . Ovarian cyst removal ~ 2003    laparoscopically; left    Family History:  Family History  Problem Relation Age of Onset  . Diabetes Mother   . Hypertension Mother   . Hypertension Father     Social History:  reports that she has never smoked. She has never used smokeless tobacco. She reports that she drinks alcohol. She reports that she uses illicit drugs  (Cocaine).  Additional Social History:  Alcohol / Drug Use Pain Medications: Pt denies use of alcohol or drugs.  CIWA: CIWA-Ar BP: 102/56 mmHg Pulse Rate: 72  COWS:    Allergies:  Allergies  Allergen Reactions  . Ativan (Lorazepam) Other (See Comments)    Sensitive. Made patient feel "crazy"  . Benzodiazepines Other (See Comments)    Disinhibition, hallucinations. 04/03/12 "I do fine w/Valium"  . Penicillins Anaphylaxis  . Rocephin (Ceftriaxone Sodium In Dextrose) Anaphylaxis  . Zofran (Ondansetron Hcl) Other (See Comments)    Makes patient skin turn red and feels hot to touch    Home Medications:  (Not in a hospital admission)  OB/GYN Status:  Patient's last menstrual period was 04/15/2012.  General Assessment Data Location of Assessment: [facility] ED ACT Assessment: Yes Living Arrangements: Non-relatives/Friends Can pt return to current living arrangement?: Yes Admission Status: Involuntary     Risk to self Suicidal Ideation: No-Not Currently/Within Last 6 Months Suicidal Intent: No Is patient at risk for suicide?: No Suicidal Plan?: No Access to Means: No What has been your use of drugs/alcohol within the last 12 months?: pt denies use Previous Attempts/Gestures: No Intentional Self Injurious Behavior: Cutting (no cutting since childhood, per pt) Comment - Self Injurious Behavior: cutting stopped-only occured in childhood Family Suicide History: No Recent stressful life event(s): Financial Problems;Other (Comment) (stress with family) Persecutory voices/beliefs?: No Depression: Yes Depression Symptoms: Isolating;Loss of interest in usual pleasures;Feeling  worthless/self pity Substance abuse history and/or treatment for substance abuse?: No Suicide prevention information given to non-admitted patients: Not applicable  Risk to Others Homicidal Ideation: No Thoughts of Harm to Others: No Current Homicidal Intent: No Current Homicidal Plan: No Access to Homicidal  Means: No History of harm to others?: No Assessment of Violence: None Noted Does patient have access to weapons?: No Criminal Charges Pending?: No Does patient have a court date: No  Psychosis Hallucinations: Auditory (pt reports voices started today, no command) Delusions: None noted  Mental Status Report Appear/Hygiene: Disheveled Eye Contact: Fair Motor Activity: Agitation Speech: Pressured Level of Consciousness: Alert Mood: Anxious Affect: Anxious;Fearful Anxiety Level: Moderate Thought Processes: Relevant Judgement: Impaired Orientation: Person;Place;Time;Situation Obsessive Compulsive Thoughts/Behaviors: None  Cognitive Functioning Concentration: Decreased Memory: Recent Intact;Remote Intact IQ: Average Insight: Poor Impulse Control: Poor Appetite: Poor Weight Loss:  (yes, unsure how much) Weight Gain: 0  Sleep: No Change Total Hours of Sleep: 5  Vegetative Symptoms: None  ADLScreening [facility] Assessment Services) Patient's cognitive ability adequate to safely complete daily activities?: Yes Patient able to express need for assistance with ADLs?: Yes Independently performs ADLs?: Yes (appropriate for developmental age)  Abuse/Neglect [facility]) Physical Abuse: Denies Verbal Abuse: Denies Sexual Abuse: Denies  Prior Inpatient Therapy Prior Inpatient Therapy: Yes Prior Therapy Dates: 03/2012 Prior Therapy Facilty/Provider(s): [facility] (multiple prior admits) Reason for Treatment: psych  Prior Outpatient Therapy Prior Outpatient Therapy: Yes Prior Therapy Dates: current Prior Therapy Facilty/Provider(s): [facility] Reason for Treatment: meds/psych  ADL Screening (condition at time of admission) Patient's cognitive ability adequate to safely complete daily activities?: Yes Patient able to express need for assistance with ADLs?: Yes Independently performs ADLs?: Yes (appropriate for developmental age) Weakness of Legs: None Weakness of Arms/Hands: None  Home Assistive  Devices/Equipment Home Assistive Devices/Equipment: None    Abuse/Neglect Assessment (Assessment to be complete while patient is alone) Physical Abuse: Denies Verbal Abuse: Denies Sexual Abuse: Denies Exploitation of patient/patient's resources: Denies Self-Neglect: Denies     Merchant navy officer (For Healthcare) Advance Directive: Patient does not have advance directive;Patient would not like information    Additional Information 1:1 In Past 12 Months?: No CIRT Risk: Yes Elopement Risk: Yes Does patient have medical clearance?: Yes     Disposition:     On Site Evaluation by:   Reviewed with Physician:     Lorri Frederick 05/11/2012 12:56 AM

## 2012-05-11 NOTE — ED Notes (Signed)
Pt attempted to runaway. Escorted back to room by [employer]

## 2012-05-11 NOTE — ED Notes (Signed)
Pt requesting medication for nerves. 

## 2012-05-11 NOTE — Progress Notes (Signed)
Spoke with [place], [place] concerning pt's vitals. Sitting-133/85 118. Standing-102/47 134. PA on unit assessing pt now.

## 2012-05-11 NOTE — ED Notes (Signed)
Pt states she cannot tolerate the [place]

## 2012-05-12 DIAGNOSIS — F251 Schizoaffective disorder, depressive type: Secondary | ICD-10-CM | POA: Diagnosis present

## 2012-05-12 DIAGNOSIS — F1920 Other psychoactive substance dependence, uncomplicated: Secondary | ICD-10-CM | POA: Diagnosis present

## 2012-05-12 DIAGNOSIS — F1120 Opioid dependence, uncomplicated: Secondary | ICD-10-CM

## 2012-05-12 DIAGNOSIS — F259 Schizoaffective disorder, unspecified: Principal | ICD-10-CM

## 2012-05-12 LAB — TSH: TSH: 1.387 u[IU]/mL (ref 0.350–4.500)

## 2012-05-12 MED ORDER — CHLORDIAZEPOXIDE HCL 25 MG PO CAPS
25.0000 mg | ORAL_CAPSULE | Freq: Four times a day (QID) | ORAL | Status: AC | PRN
  Administered 2012-05-13 – 2012-05-15 (×4): 25 mg via ORAL
  Filled 2012-05-12 (×5): qty 1

## 2012-05-12 MED ORDER — ADULT MULTIVITAMIN W/MINERALS CH
1.0000 | ORAL_TABLET | Freq: Every day | ORAL | Status: DC
  Administered 2012-05-12 – 2012-05-20 (×9): 1 via ORAL
  Filled 2012-05-12 (×10): qty 1

## 2012-05-12 MED ORDER — DICYCLOMINE HCL 20 MG PO TABS: 20.0000 mg | ORAL_TABLET | Freq: Four times a day (QID) | ORAL | Status: AC | PRN

## 2012-05-12 MED ORDER — PANTOPRAZOLE SODIUM 40 MG PO TBEC
40.0000 mg | DELAYED_RELEASE_TABLET | Freq: Every day | ORAL | Status: DC
  Administered 2012-05-12 – 2012-05-19 (×8): 40 mg via ORAL
  Filled 2012-05-12 (×10): qty 1

## 2012-05-12 MED ORDER — CHLORDIAZEPOXIDE HCL 25 MG PO CAPS
25.0000 mg | ORAL_CAPSULE | ORAL | Status: AC
  Administered 2012-05-14 (×2): 25 mg via ORAL
  Filled 2012-05-12 (×2): qty 1

## 2012-05-12 MED ORDER — ALUM & MAG HYDROXIDE-SIMETH 200-200-20 MG/5ML PO SUSP
30.0000 mL | ORAL | Status: DC | PRN
  Administered 2012-05-12 – 2012-05-15 (×3): 30 mL via ORAL

## 2012-05-12 MED ORDER — CHLORDIAZEPOXIDE HCL 25 MG PO CAPS
25.0000 mg | ORAL_CAPSULE | Freq: Every day | ORAL | Status: AC
  Administered 2012-05-15: 25 mg via ORAL
  Filled 2012-05-12: qty 1

## 2012-05-12 MED ORDER — NAPROXEN 500 MG PO TABS: 500.0000 mg | ORAL_TABLET | Freq: Two times a day (BID) | ORAL | Status: DC | PRN

## 2012-05-12 MED ORDER — CHLORDIAZEPOXIDE HCL 25 MG PO CAPS
25.0000 mg | ORAL_CAPSULE | Freq: Three times a day (TID) | ORAL | Status: AC
  Administered 2012-05-13 (×3): 25 mg via ORAL
  Filled 2012-05-12 (×3): qty 1

## 2012-05-12 MED ORDER — CLONIDINE HCL 0.1 MG PO TABS
0.1000 mg | ORAL_TABLET | ORAL | Status: AC
  Administered 2012-05-14 – 2012-05-15 (×3): 0.1 mg via ORAL
  Filled 2012-05-12 (×4): qty 1

## 2012-05-12 MED ORDER — THIAMINE HCL 100 MG/ML IJ SOLN
100.0000 mg | Freq: Once | INTRAMUSCULAR | Status: AC
  Administered 2012-05-12: 100 mg via INTRAMUSCULAR

## 2012-05-12 MED ORDER — VITAMIN B-1 100 MG PO TABS
100.0000 mg | ORAL_TABLET | Freq: Every day | ORAL | Status: DC
  Administered 2012-05-13 – 2012-05-20 (×8): 100 mg via ORAL
  Filled 2012-05-12 (×9): qty 1

## 2012-05-12 MED ORDER — CLONIDINE HCL 0.1 MG PO TABS
0.1000 mg | ORAL_TABLET | Freq: Four times a day (QID) | ORAL | Status: AC
  Administered 2012-05-12 – 2012-05-13 (×6): 0.1 mg via ORAL
  Filled 2012-05-12 (×7): qty 1

## 2012-05-12 MED ORDER — CHLORDIAZEPOXIDE HCL 25 MG PO CAPS
25.0000 mg | ORAL_CAPSULE | Freq: Four times a day (QID) | ORAL | Status: AC
  Administered 2012-05-12 (×3): 25 mg via ORAL
  Filled 2012-05-12 (×3): qty 1

## 2012-05-12 MED ORDER — LOPERAMIDE HCL 2 MG PO CAPS: 2.0000 mg | ORAL_CAPSULE | ORAL | Status: DC | PRN

## 2012-05-12 MED ORDER — HYDROXYZINE HCL 25 MG PO TABS: 25.0000 mg | ORAL_TABLET | Freq: Four times a day (QID) | ORAL | Status: DC | PRN

## 2012-05-12 MED ORDER — METHOCARBAMOL 500 MG PO TABS
500.0000 mg | ORAL_TABLET | Freq: Three times a day (TID) | ORAL | Status: AC | PRN
  Administered 2012-05-12 – 2012-05-16 (×6): 500 mg via ORAL
  Filled 2012-05-12 (×7): qty 1

## 2012-05-12 MED ORDER — CHLORDIAZEPOXIDE HCL 25 MG PO CAPS
50.0000 mg | ORAL_CAPSULE | Freq: Once | ORAL | Status: AC
  Administered 2012-05-12: 50 mg via ORAL
  Filled 2012-05-12: qty 2

## 2012-05-12 MED ORDER — ONDANSETRON 4 MG PO TBDP
4.0000 mg | ORAL_TABLET | Freq: Four times a day (QID) | ORAL | Status: DC | PRN
  Administered 2012-05-12: 4 mg via ORAL
  Filled 2012-05-12: qty 1

## 2012-05-12 MED ORDER — CLONIDINE HCL 0.1 MG PO TABS
0.1000 mg | ORAL_TABLET | Freq: Every day | ORAL | Status: AC
  Administered 2012-05-17: 0.1 mg via ORAL
  Filled 2012-05-12 (×2): qty 1

## 2012-05-12 MED ORDER — ONDANSETRON 4 MG PO TBDP: 4.0000 mg | ORAL_TABLET | Freq: Four times a day (QID) | ORAL | Status: DC | PRN

## 2012-05-12 MED ORDER — CIPROFLOXACIN HCL 500 MG PO TABS
500.0000 mg | ORAL_TABLET | Freq: Two times a day (BID) | ORAL | Status: AC
  Administered 2012-05-12 – 2012-05-18 (×14): 500 mg via ORAL
  Filled 2012-05-12 (×16): qty 1

## 2012-05-12 MED ORDER — HYDROXYZINE HCL 25 MG PO TABS
25.0000 mg | ORAL_TABLET | Freq: Four times a day (QID) | ORAL | Status: AC | PRN
  Administered 2012-05-13 – 2012-05-14 (×3): 25 mg via ORAL
  Filled 2012-05-12 (×2): qty 1

## 2012-05-12 MED ORDER — LOPERAMIDE HCL 2 MG PO CAPS: 2.0000 mg | ORAL_CAPSULE | ORAL | Status: AC | PRN

## 2012-05-12 NOTE — Progress Notes (Signed)
D: In hallway looking for her RN on approach. Appears anxious and uncomfortable. Cooperative with assessment. No acute distress noted. States she has had a bad day r/t her withdrawal symptoms. Currently endorses anxious mood, body aches, tremors and restlessness. States she does feel slightly better, but again does not appear so. Did c/o body aches and requested a prn for pain. Denies SI/HI/AVH and contracts for safety.  A: Safety has been maintained with Q15 min observation. Support and encouragement provided. POC and medications for the shift reviewed and understanding verbalized. Meds given as ordered by MD. PRNS provided for pain and WD symptoms. Discussed her pervious drug habits and questioned whether she had been using opiates based on the withdrawal symptoms she presents. She continues to deny opiate abuse and attributes her WD symptoms to benzos.  R: Pt remains safe with Q15 minute observation. She continues to be experiencing significant WD symptoms and appears quite uncomfortable. She is complaint with medications and programming. Will monitor closely r/t significant WD symptoms. Will continue Q15 minute observation and continue current POC

## 2012-05-12 NOTE — H&P (Signed)
Psychiatric Admission Assessment Adult  Patient Identification:  Patricia [name] Date of Evaluation:  05/12/2012 Chief Complaint:  Schizoaffective Disorder  History of Present Illness:  Time was spent today discussing with the patient her current symptoms. The patient states that she is having difficulty initiating and maintaining sleep and reports a decreased appetite. She reports severe feelings of sadness, anhedonia and depressed mood but denies any current suicidal or homicidal ideations. She denies any auditory or visual hallucinations or delusional thinking and reports severe anxiety symptoms.  The patient states that she has been using cocaine and amphetamines. She also reports to taking 40 mgs of Valium per day. According to the patient's boyfriend, she has a long history of substance abuse and likely is not being truthful in her disclosure.   Past Medical History:   Past Medical History  Diagnosis Date  . Depression   . Schizoaffective disorder, bipolar type    Allergies:   Allergies  Allergen Reactions  . Ativan (Lorazepam) Other (See Comments)    Sensitive. Made patient feel "crazy"  . Penicillins Anaphylaxis  . Rocephin (Ceftriaxone Sodium In Dextrose) Anaphylaxis  . Zofran (Ondansetron Hcl) Other (See Comments)    Makes patient skin turn red and feels hot to touch   PTA Medications: Prescriptions prior to admission  Medication Sig Dispense Refill  . acetaminophen (TYLENOL) 500 MG tablet Take 500 mg by mouth every 6 (six) hours as needed. For pain      . diazepam (VALIUM) 10 MG tablet Take 10 mg by mouth 3 (three) times daily.       . diclofenac (VOLTAREN) 75 MG EC tablet Take 75 mg by mouth 2 (two) times daily.      Marland Kitchen escitalopram (LEXAPRO) 20 MG tablet Take 20 mg by mouth daily.      Marland Kitchen gabapentin (NEURONTIN) 300 MG capsule Take 300 mg by mouth 4 (four) times daily.      . hydrochlorothiazide (HYDRODIURIL) 25 MG tablet Take 25 mg by mouth daily as needed.       .  iloperidone (FANAPT) 4 MG TABS Take 2 mg by mouth 2 (two) times daily.      . potassium chloride SA (K-DUR,KLOR-CON) 20 MEQ tablet Take 20 mEq by mouth daily.      . traZODone (DESYREL) 100 MG tablet Take 200 mg by mouth at bedtime.       Consequences of Substance Abuse: Medical Consequences:   Family Consequences:   Withdrawal Symptoms:   Cramps Diarrhea Nausea Tremors Vomiting  Social History: Please see social workers assessment.l   Family History:   Family History  Problem Relation Age of Onset  . Diabetes Mother   . Hypertension Mother   . Hypertension Father    Mental Status Examination/Evaluation:  Demographic factors: Assessment Details  Time of Assessment: Admission  Information Obtained From: Patient   Current Mental Status:  Loss Factors:  Historical Factors:  Risk Reduction Factors:   CLINICAL FACTORS:  Severe Anxiety and/or Agitation  Depression: Anhedonia  Comorbid alcohol abuse/dependence  Hopelessness  Insomnia  Severe  Alcohol/Substance Abuse/Dependencies  More than one psychiatric diagnosis  Previous Psychiatric Diagnoses and Treatments  Medical Diagnoses and Treatments/Surgeries   COGNITIVE FEATURES THAT CONTRIBUTE TO RISK:  Loss of executive function  Thought constriction (tunnel vision)   Diagnosis:  Axis I: Schizoaffective Disorder - Depressed Type.  Polysubstance Dependence - Including Opioids, Amphetamines, Benzodiazepines.  Axis II: Deferred.  Axis III: 1. Urinary Tract Infection.  Axis IV: Chronic Mental Illness. Chronic Substance  Abuse Issues.  Axis V: GAF at time of admission approximately 30. Highest GAF in the past year approximately 50.   The patient was seen today and reports the following:   ADL's: Intact.  Sleep: The patient reports to having significant difficulty initiating and maintaining sleep.  Appetite: The patient reports a decreased appetite today.   Mild>(1-10) >Severe  Hopelessness (1-10): 10  Depression  (1-10): 10  Anxiety (1-10): 10   Suicidal Ideation: The patient denies any suicidal ideations today.  Plan: No  Intent: No  Means: No   Homicidal Ideation: The patient denies any homicidal ideations today.  Plan: No  Intent: No.  Means: No   General Appearance/Behavior: The patient was cooperative today with this provider but appeared to be going thru significant withdrawal this morning.  Eye Contact: Good.  Speech: Appropriate in rate and volume with no pressuring of speech noted today.  Motor Behavior: wnl.  Level of Consciousness: Alert and Oriented x 3.  Mental Status: Alert and Oriented x 3.  Mood: Severely Depressed.  Affect: Severely Constricted.  Anxiety Level: Severe anxiety reported today.  Thought Process: wnl.  Thought Content: The patient denies any current auditory or visual hallucinations or delusional thinking.  Perception: wnl.  Judgment: Poor.  Insight: Poor.  Cognition: Oriented to person, place and time.   Current Medications:   .  chlordiazePOXIDE  25 mg  Oral  QID    Followed by   .  chlordiazePOXIDE  25 mg  Oral  TID    Followed by   .  chlordiazePOXIDE  25 mg  Oral  BH-qamhs    Followed by   .  chlordiazePOXIDE  25 mg  Oral  Daily   .  chlordiazePOXIDE  50 mg  Oral  Once   .  ciprofloxacin  500 mg  Oral  BID   .  cloNIDine  0.1 mg  Oral  QID    Followed by   .  cloNIDine  0.1 mg  Oral  BH-qamhs    Followed by   .  cloNIDine  0.1 mg  Oral  QAC breakfast   .  escitalopram  20 mg  Oral  Daily   .  gabapentin  300 mg  Oral  QID   .  hydrochlorothiazide  25 mg  Oral  Daily   .  iloperidone  2 mg  Oral  BID   .  levofloxacin  500 mg  Oral  Daily   .  multivitamin with minerals  1 tablet  Oral  Daily   .  potassium chloride  20 mEq  Oral  Daily   .  thiamine  100 mg  Intramuscular  Once   .  thiamine  100 mg  Oral  Daily   .  traZODone  200 mg  Oral  QHS   .  DISCONTD: diclofenac  75 mg  Oral  BID    Current Medications:  Current  Facility-Administered Medications  Medication Dose Route Frequency Provider Last Rate Last Dose  . acetaminophen (TYLENOL) tablet 650 mg  650 mg Oral Q6H PRN Kerry Hough, PA      . chlordiazePOXIDE (LIBRIUM) capsule 25 mg  25 mg Oral Q6H PRN Curlene Labrum Emelly Wurtz, MD      . chlordiazePOXIDE (LIBRIUM) capsule 25 mg  25 mg Oral QID Curlene Labrum Kayshaun Polanco, MD   25 mg at 05/12/12 1435   Followed by  . chlordiazePOXIDE (LIBRIUM) capsule 25 mg  25 mg Oral  TID Ronny Bacon, MD       Followed by  . chlordiazePOXIDE (LIBRIUM) capsule 25 mg  25 mg Oral BH-qamhs Laurren Lepkowski D Riyansh Gerstner, MD       Followed by  . chlordiazePOXIDE (LIBRIUM) capsule 25 mg  25 mg Oral Daily Curlene Labrum Kenora Spayd, MD      . chlordiazePOXIDE (LIBRIUM) capsule 50 mg  50 mg Oral Once Ronny Bacon, MD   50 mg at 05/12/12 0910  . ciprofloxacin (CIPRO) tablet 500 mg  500 mg Oral BID Curlene Labrum Jendaya Gossett, MD   500 mg at 05/12/12 1942  . cloNIDine (CATAPRES) tablet 0.1 mg  0.1 mg Oral QID Curlene Labrum Imajean Mcdermid, MD   0.1 mg at 05/12/12 1706   Followed by  . cloNIDine (CATAPRES) tablet 0.1 mg  0.1 mg Oral BH-qamhs Sumedha Munnerlyn D Troi Florendo, MD       Followed by  . cloNIDine (CATAPRES) tablet 0.1 mg  0.1 mg Oral QAC breakfast Curlene Labrum Yomaira Solar, MD      . dicyclomine (BENTYL) tablet 20 mg  20 mg Oral Q6H PRN Curlene Labrum Tinlee Navarrette, MD      . escitalopram (LEXAPRO) tablet 20 mg  20 mg Oral Daily Kerry Hough, PA   20 mg at 05/12/12 0800  . gabapentin (NEURONTIN) capsule 300 mg  300 mg Oral QID Kerry Hough, PA   300 mg at 05/12/12 1706  . hydrochlorothiazide (HYDRODIURIL) tablet 25 mg  25 mg Oral Daily Kerry Hough, PA   25 mg at 05/12/12 9604  . hydrOXYzine (ATARAX/VISTARIL) tablet 25 mg  25 mg Oral Q6H PRN Ronny Bacon, MD      . iloperidone (FANAPT) tablet 2 mg  2 mg Oral BID Kerry Hough, PA   2 mg at 05/12/12 1707  . levofloxacin (LEVAQUIN) tablet 500 mg  500 mg Oral Daily Kerry Hough, PA   500 mg at 05/12/12 5409  . loperamide (IMODIUM) capsule 2-4  mg  2-4 mg Oral PRN Curlene Labrum Taha Dimond, MD      . magnesium hydroxide (MILK OF MAGNESIA) suspension 30 mL  30 mL Oral Daily PRN Kerry Hough, PA      . methocarbamol (ROBAXIN) tablet 500 mg  500 mg Oral Q8H PRN Curlene Labrum Amrit Cress, MD   500 mg at 05/12/12 1305  . multivitamin with minerals tablet 1 tablet  1 tablet Oral Daily Ronny Bacon, MD   1 tablet at 05/12/12 0924  . ondansetron (ZOFRAN-ODT) disintegrating tablet 4 mg  4 mg Oral Q6H PRN Ronny Bacon, MD   4 mg at 05/12/12 0932  . potassium chloride SA (K-DUR,KLOR-CON) CR tablet 20 mEq  20 mEq Oral Daily Kerry Hough, PA   20 mEq at 05/12/12 8119  . thiamine (B-1) injection 100 mg  100 mg Intramuscular Once Ronny Bacon, MD   100 mg at 05/12/12 0925  . thiamine (VITAMIN B-1) tablet 100 mg  100 mg Oral Daily Curlene Labrum Elizabelle Fite, MD      . traZODone (DESYREL) tablet 200 mg  200 mg Oral QHS Kerry Hough, PA   200 mg at 05/11/12 2257  . traZODone (DESYREL) tablet 50 mg  50 mg Oral QHS PRN Kerry Hough, PA      . DISCONTD: chlordiazePOXIDE (LIBRIUM) capsule 25 mg  25 mg Oral QID PRN Kerry Hough, PA   25 mg at 05/12/12 0755  . DISCONTD: diclofenac (VOLTAREN) EC tablet 75 mg  75  mg Oral BID Kerry Hough, PA   75 mg at 05/12/12 2956  . DISCONTD: hydrOXYzine (ATARAX/VISTARIL) tablet 25 mg  25 mg Oral Q6H PRN Ronny Bacon, MD      . DISCONTD: loperamide (IMODIUM) capsule 2-4 mg  2-4 mg Oral PRN Ronny Bacon, MD      . DISCONTD: naproxen (NAPROSYN) tablet 500 mg  500 mg Oral BID PRN Ronny Bacon, MD      . DISCONTD: ondansetron (ZOFRAN-ODT) disintegrating tablet 4 mg  4 mg Oral Q6H PRN Ronny Bacon, MD       Facility-Administered Medications Ordered in Other Encounters  Medication Dose Route Frequency Provider Last Rate Last Dose  . DISCONTD: acetaminophen (TYLENOL) tablet 650 mg  650 mg Oral Q6H PRN Kerry Hough, PA      . DISCONTD: diazepam (VALIUM) tablet 10 mg  10 mg Oral Q8H PRN Benny Lennert, MD   10 mg  at 05/11/12 1329  . DISCONTD: escitalopram (LEXAPRO) tablet 20 mg  20 mg Oral Daily Arman Filter, NP   20 mg at 05/11/12 1045  . DISCONTD: gabapentin (NEURONTIN) capsule 300 mg  300 mg Oral QID Arman Filter, NP   300 mg at 05/11/12 1855  . DISCONTD: hydrochlorothiazide (HYDRODIURIL) tablet 25 mg  25 mg Oral Daily Arman Filter, NP   25 mg at 05/11/12 1045  . DISCONTD: iloperidone (FANAPT) tablet 2 mg  2 mg Oral BID Arman Filter, NP   2 mg at 05/11/12 1330  . DISCONTD: magnesium hydroxide (MILK OF MAGNESIA) suspension 30 mL  30 mL Oral Daily PRN Kerry Hough, PA      . DISCONTD: promethazine (PHENERGAN) suppository 50 mg  50 mg Rectal Q6H PRN Arman Filter, NP   50 mg at 05/11/12 0501  . DISCONTD: traZODone (DESYREL) tablet 200 mg  200 mg Oral QHS Arman Filter, NP   200 mg at 05/11/12 0532   Review of Systems:  Neurological: No headaches, seizures or dizziness reported.  G.I.: The patient reports significant G.I. Upset today.  Musculoskeletal: The patient denies any musculoskeletal issues today.   Time was spent today discussing with the patient her current symptoms. The patient states that she is having difficulty initiating and maintaining sleep and reports a decreased appetite. She reports severe feelings of sadness, anhedonia and depressed mood but denies any current suicidal or homicidal ideations. She denies any auditory or visual hallucinations or delusional thinking and reports severe anxiety symptoms.   The patient states that she has been using cocaine and amphetamines. She also reports to taking 40 mgs of Valium per day. According to the patient's boyfriend, she has a long history of substance abuse and likely is not being truthful in her disclosure.   Treatment Plan Summary:  1. Daily contact with patient to assess and evaluate symptoms and progress in treatment.  2. Medication management  3. The patient will deny suicidal ideations or homicidal ideations for 48 hours prior to  discharge and have a depression and anxiety rating of 3 or less. The patient will also deny any auditory or visual hallucinations or delusional thinking.  4. The patient will deny any symptoms of substance withdrawal at time of discharge.   Plan:  1. Will restart the patient on her current medications as listed above.  2. Will place the patient on a Librium Protocol for detox from alcohol dependence.  3. Will place the patient on a Clonidine Protocol  for detox from her opioid dependence.  4. Laboratory Studies reviewed.  5. Will continue to monitor.   SUICIDE RISK:  Minimal: No identifiable suicidal ideation. Patients presenting with no risk factors but with morbid ruminations; may be classified as minimal risk based on the severity of the depressive symptoms  Physical Examination:  The patient was seen and evaluated in the Emergency Department prior to his admission to [facility] Please see the ED notes for more details.  Alaiya Martindelcampo 8/27/20138:00 PM

## 2012-05-12 NOTE — [facility] Suicide Risk Assessment (Signed)
Suicide Risk Assessment  Admission Assessment     Demographic factors:  Assessment Details Time of Assessment: Admission Information Obtained From: Patient Current Mental Status:    Loss Factors:    Historical Factors:    Risk Reduction Factors:     CLINICAL FACTORS:   Severe Anxiety and/or Agitation Depression:   Anhedonia Comorbid alcohol abuse/dependence Hopelessness Insomnia Severe Alcohol/Substance Abuse/Dependencies More than one psychiatric diagnosis Previous Psychiatric Diagnoses and Treatments Medical Diagnoses and Treatments/Surgeries  COGNITIVE FEATURES THAT CONTRIBUTE TO RISK:  Loss of executive function Thought constriction (tunnel vision)    Diagnosis:  Axis I:   Schizoaffective Disorder - Depressed Type.    Polysubstance Dependence - Including Opioids, Amphetamines, Benzodiazepines. Axis II:  Deferred. Axis III: 1. Urinary Tract Infection. Axis IV: Chronic Mental Illness.  Chronic Substance Abuse Issues.   Axis V:  GAF at time of admission approximately 30.  Highest GAF in the past year approximately 50.   The patient was seen today and reports the following:   ADL's: Intact.  Sleep: The patient reports to having significant difficulty initiating and maintaining sleep.  Appetite: The patient reports a decreased appetite today.   Mild>(1-10) >Severe  Hopelessness (1-10): 10  Depression (1-10): 10  Anxiety (1-10): 10   Suicidal Ideation:  The patient denies any suicidal ideations today.  Plan: No  Intent: No  Means: No   Homicidal Ideation: The patient denies any homicidal ideations today.  Plan: No  Intent: No.  Means: No   General Appearance/Behavior: The patient was cooperative today with this provider but appeared to be going thru significant withdrawal this morning. Eye Contact: Good.  Speech: Appropriate in rate and volume with no pressuring of speech noted today.  Motor Behavior: wnl.  Level of Consciousness: Alert and Oriented x 3.    Mental Status: Alert and Oriented x 3.  Mood: Severely Depressed.  Affect: Severely Constricted.  Anxiety Level: Severe anxiety reported today.  Thought Process: wnl.  Thought Content: The patient denies any current auditory or visual hallucinations or delusional thinking.  Perception: wnl.  Judgment: Poor.  Insight: Poor.  Cognition: Oriented to person, place and time.  Current Medications:     . chlordiazePOXIDE  25 mg Oral QID   Followed by  . chlordiazePOXIDE  25 mg Oral TID   Followed by  . chlordiazePOXIDE  25 mg Oral BH-qamhs   Followed by  . chlordiazePOXIDE  25 mg Oral Daily  . chlordiazePOXIDE  50 mg Oral Once  . ciprofloxacin  500 mg Oral BID  . cloNIDine  0.1 mg Oral QID   Followed by  . cloNIDine  0.1 mg Oral BH-qamhs   Followed by  . cloNIDine  0.1 mg Oral QAC breakfast  . escitalopram  20 mg Oral Daily  . gabapentin  300 mg Oral QID  . hydrochlorothiazide  25 mg Oral Daily  . iloperidone  2 mg Oral BID  . levofloxacin  500 mg Oral Daily  . multivitamin with minerals  1 tablet Oral Daily  . potassium chloride  20 mEq Oral Daily  . thiamine  100 mg Intramuscular Once  . thiamine  100 mg Oral Daily  . traZODone  200 mg Oral QHS  . DISCONTD: diclofenac  75 mg Oral BID   Review of Systems:  Neurological: No headaches, seizures or dizziness reported.  G.I.: The patient reports significant G.I. Upset today.  Musculoskeletal: The patient denies any musculoskeletal issues today.   Time was spent today discussing with the patient  her current symptoms. The patient states that she is having difficulty initiating and maintaining sleep and reports a decreased appetite. She reports severe feelings of sadness, anhedonia and depressed mood but denies any current suicidal or homicidal ideations.  She denies any auditory or visual hallucinations or delusional thinking and reports severe anxiety symptoms.  The patient states that she has been using cocaine and amphetamines.   She also reports to taking 40 mgs of Valium per day.  According to the patient's boyfriend, she has a long history of substance abuse and likely is not being truthful in her disclosure.  Treatment Plan Summary:  1. Daily contact with patient to assess and evaluate symptoms and progress in treatment.  2. Medication management  3. The patient will deny suicidal ideations or homicidal ideations for 48 hours prior to discharge and have a depression and anxiety rating of 3 or less. The patient will also deny any auditory or visual hallucinations or delusional thinking.  4. The patient will deny any symptoms of substance withdrawal at time of discharge.   Plan:  1. Will restart the patient on her current medications as listed above. 2. Will place the patient on a Librium Protocol for detox from alcohol dependence. 3. Will place the patient on a Clonidine Protocol for detox from her opioid dependence. 4. Laboratory Studies reviewed.  5. Will continue to monitor.   SUICIDE RISK:   Minimal: No identifiable suicidal ideation.  Patients presenting with no risk factors but with morbid ruminations; may be classified as minimal risk based on the severity of the depressive symptoms   RANDY READLING 05/12/2012, 6:43 PM

## 2012-05-12 NOTE — Progress Notes (Signed)
[facility] Group Notes:  (Counselor/Nursing/MHT/Case Management/Adjunct)  05/12/2012 1:37 PM  Type of Therapy:  Group Therapy  Participation Level:  Did Not Attend    Veto Kemps 05/12/2012, 1:37 PM

## 2012-05-12 NOTE — Progress Notes (Signed)
[facility] Group Notes:  (Counselor/Nursing/MHT/Case Management/Adjunct)  05/12/2012 8:47 PM  Type of Therapy:  Psychoeducational Skills  Participation Level:  Active  Participation Quality:  Appropriate and Attentive  Affect:  Appropriate  Cognitive:  Appropriate  Insight:  Good  Engagement in Group:  Good  Engagement in Therapy:  Good  Modes of Intervention:  Support  Summary of Progress/Problems: Pt. Stated her day was rough but really appreciate staffs' help in helping feeling better. Her goal is to continue to feel better physically and mentally    Patricia [name] 05/12/2012, 8:47 PM

## 2012-05-12 NOTE — Progress Notes (Signed)
Nutrition Brief Note  Patient identified on the Malnutrition Screening Tool (MST) report for unintended weight loss and poor appetite, generating a score of 3.   Body mass index is 33.56 kg/(m^2). Pt meets criteria for class I obesity based on current BMI.   - Pt has had nausea/vomiting since admission. Pt screened for unintended weight loss and poor appetite, however past records show pt's weight has increased 22 pounds in the past month. Met with pt who stated she was nauseated and did not want to talk. Pt with low potassium on 8/25, improved to WNL yesterday.   Wt Readings from Last 10 Encounters:  05/11/12 224 lb (101.606 kg)  04/03/12 202 lb (91.627 kg)  02/27/12 220 lb (99.791 kg)  02/16/12 244 lb (110.678 kg)  11/22/11 192 lb (87.091 kg)   Intervention: Anti-emetics per MD. Hopefully as nausea/vomiting improves, pt's intake will improve.   No further nutrition intervention indicated at this time. Pt states she is without any nutritional needs, just trying to detox from cocaine.   Dietitian# [phone]

## 2012-05-12 NOTE — Progress Notes (Signed)
Pt. Is lying in the bed and appears very comfortable. States she is comfortable and that she is not in any pain. Pt does not appear in any distress and denies and n/v or body aches at this time. Contracts for safety. No Si or HI

## 2012-05-12 NOTE — ED Provider Notes (Signed)
Medical screening examination/treatment/procedure(s) were performed by non-physician practitioner and as supervising physician I was immediately available for consultation/collaboration.   Glynn Octave, MD 05/12/12 [phone]

## 2012-05-12 NOTE — Progress Notes (Signed)
Patient experiencing severe withdrawal symptoms.  She did attend treatment team meeting and described her substance abuse.  She minimized her cocaine use over the weekend; stated that she was prescribed valium since 2001.  She report she takes 30 mg valium per day since 2001.  She didn't understand why she is suddenly been cut off from it.  Patient denies any SI/HI/AVH.  She did state, however, that she had heard voices in the past.  Her UDS was positive for cocaine, amphedimines and benzos.  She was admitted involuntary because her boyfriend brought her in because she was having auditory hallucinations.  Counselor obtained consent to contact boyfriend to get further information concerning patient's drug use.  Patient has taken several baths; had a loading dose of librium; librium protocol; clonodine protocol.  Her blood pressure and pulse has been monitored closely.    Continue to monitor medication management and MD orders.  Collaborate with treatment team regarding patient's POC  Safety checks continued every 15 minutes.  Patient is cooperative with staff.  Continue to reassure and support patient.  Encourage patient to drink fluids.

## 2012-05-12 NOTE — Progress Notes (Signed)
Psychoeducational Group Note  Date:  05/12/2012 Time:  1100  Group Topic/Focus:  Recovery Goals:   The focus of this group is to identify appropriate goals for recovery and establish a plan to achieve them.  Participation Level: Did Not Attend  Participation Quality:  Not Applicable  Affect:  Not Applicable  Cognitive:  Not Applicable  Insight:  Not Applicable  Engagement in Group: Not Applicable  Additional Comments:  Pt was unable to attend group this morning due to the pt being medicated and allow to sleep.  Patricia [name] E 05/12/2012, 1:27 PM

## 2012-05-13 NOTE — Progress Notes (Signed)
D: Patient in day room on approach.  Patient states she is continuing to go through withdrawals.  Patient states he needs all of her prn medications but it is not time for them yet.  Patient voices to Clinical research associate that as soon as it is time for the prn medications she would like to have them.  Patient states she has not been eating well.  Patient states at dinner she only ate a couple of lima beans.  Patient rates depression 8/10 and hopelessness 8/10.  Patient denies SI/HI and denies AVH.  Patient states she has not been able to attend groups due to not feeling well. A: Patient was given frozen meal to try to eat. Staff to monitor Q 15 mins for safety.  Scheduled medications administered.  Prn maalox administered at 1946 for indigestion and prn librium administered at 2131 for withdrawals R:  Patient remains safe on the unit. Taking administered medications. Patient cooperative but continues to need multiple interventions from the staff. Patient states she could only eat a couple of bites of frozen meal. Patient states she only wants her boyfriend to be told information about her. Patient states she does not any information about her and her stay to be shared with her mother.

## 2012-05-13 NOTE — Progress Notes (Signed)
[facility] MD Progress Note  05/13/2012 12:16 PM  S: "I'm feeling so nervous, shaky and irritated. Every time that I tried to sleep, someone will wake me up. I came to this hospital because I was vomiting and hallucinating (auditory/visual hallucinations). The hallucinations are better. But, I was taken off of my valium, that is why I am shaking so much. I have been on 10 mg valium for 10 years. My mood is very sad, irritated and I am agitated. I don't feel like coming out of my room today. I need to rest. I do not have any appetite".  Diagnosis:   Axis I: Polysubstance dependence including opioid type drug, continuous use, Schizoaffective disorder, depressive type. Axis II: Deferred Axis III:  Past Medical History  Diagnosis Date  . Depression   . Schizoaffective disorder, bipolar type    Axis IV: other psychosocial or environmental problems Axis V: 41-50 serious symptoms  ADL's:  Intact  Sleep: Good  Appetite:  Fair  Suicidal Ideation: "NO" Plan:  No Intent:  No Means:  No Homicidal Ideation: "No" Plan:  No Intent:  No Means:  no  AEB (as evidenced by): Per patient's reports.  Mental Status Examination/Evaluation: Objective:  Appearance: Casual  Eye Contact::  Good  Speech:  Clear and Coherent  Volume:  Normal  Mood:  Anxious, Depressed and Irritable, agitated  Affect:  Flat and Tearful  Thought Process:  Coherent  Orientation:  Full  Thought Content:  Rumination  Suicidal Thoughts:  No  Homicidal Thoughts:  No  Memory:  Immediate;   Good Recent;   Good Remote;   Good  Judgement:  Fair  Insight:  Lacking  Psychomotor Activity:  Restlessness and Tremor  Concentration:  Poor  Recall:  Good  Akathisia:  No  Handed:  Right  AIMS (if indicated):     Assets:  Desire for Improvement  Sleep:  Number of Hours: 6.75    Vital Signs:Blood pressure 95/67, pulse 105, temperature 98.3 F (36.8 C), temperature source Oral, resp. rate 16, height 5' 8.5" (1.74 m), weight 101.606  kg (224 lb), last menstrual period 04/27/2012. Current Medications: Current Facility-Administered Medications  Medication Dose Route Frequency Provider Last Rate Last Dose  . acetaminophen (TYLENOL) tablet 650 mg  650 mg Oral Q6H PRN Kerry Hough, PA   650 mg at 05/13/12 0834  . alum & mag hydroxide-simeth (MAALOX/MYLANTA) 200-200-20 MG/5ML suspension 30 mL  30 mL Oral Q4H PRN Curlene Labrum Readling, MD   30 mL at 05/12/12 2037  . chlordiazePOXIDE (LIBRIUM) capsule 25 mg  25 mg Oral Q6H PRN Curlene Labrum Readling, MD   25 mg at 05/13/12 0630  . chlordiazePOXIDE (LIBRIUM) capsule 25 mg  25 mg Oral QID Curlene Labrum Readling, MD   25 mg at 05/12/12 2118   Followed by  . chlordiazePOXIDE (LIBRIUM) capsule 25 mg  25 mg Oral TID Curlene Labrum Readling, MD   25 mg at 05/13/12 1203   Followed by  . chlordiazePOXIDE (LIBRIUM) capsule 25 mg  25 mg Oral BH-qamhs Randy D Readling, MD       Followed by  . chlordiazePOXIDE (LIBRIUM) capsule 25 mg  25 mg Oral Daily Curlene Labrum Readling, MD      . ciprofloxacin (CIPRO) tablet 500 mg  500 mg Oral BID Curlene Labrum Readling, MD   500 mg at 05/13/12 0834  . cloNIDine (CATAPRES) tablet 0.1 mg  0.1 mg Oral QID Curlene Labrum Readling, MD   0.1 mg at 05/13/12 1203  Followed by  . cloNIDine (CATAPRES) tablet 0.1 mg  0.1 mg Oral BH-qamhs Randy D Readling, MD       Followed by  . cloNIDine (CATAPRES) tablet 0.1 mg  0.1 mg Oral QAC breakfast Curlene Labrum Readling, MD      . dicyclomine (BENTYL) tablet 20 mg  20 mg Oral Q6H PRN Curlene Labrum Readling, MD      . escitalopram (LEXAPRO) tablet 20 mg  20 mg Oral Daily Kerry Hough, PA   20 mg at 05/13/12 1610  . gabapentin (NEURONTIN) capsule 300 mg  300 mg Oral QID Kerry Hough, PA   300 mg at 05/13/12 1202  . hydrochlorothiazide (HYDRODIURIL) tablet 25 mg  25 mg Oral Daily Kerry Hough, PA   25 mg at 05/13/12 9604  . hydrOXYzine (ATARAX/VISTARIL) tablet 25 mg  25 mg Oral Q6H PRN Ronny Bacon, MD      . iloperidone (FANAPT) tablet 2 mg  2 mg Oral BID  Kerry Hough, PA   2 mg at 05/13/12 0839  . loperamide (IMODIUM) capsule 2-4 mg  2-4 mg Oral PRN Curlene Labrum Readling, MD      . magnesium hydroxide (MILK OF MAGNESIA) suspension 30 mL  30 mL Oral Daily PRN Kerry Hough, PA      . methocarbamol (ROBAXIN) tablet 500 mg  500 mg Oral Q8H PRN Curlene Labrum Readling, MD   500 mg at 05/13/12 5409  . multivitamin with minerals tablet 1 tablet  1 tablet Oral Daily Ronny Bacon, MD   1 tablet at 05/13/12 [phone]  . pantoprazole (PROTONIX) EC tablet 40 mg  40 mg Oral QHS Curlene Labrum Readling, MD   40 mg at 05/12/12 2118  . potassium chloride SA (K-DUR,KLOR-CON) CR tablet 20 mEq  20 mEq Oral Daily Kerry Hough, PA   20 mEq at 05/13/12 1478  . thiamine (VITAMIN B-1) tablet 100 mg  100 mg Oral Daily Curlene Labrum Readling, MD   100 mg at 05/13/12 2956  . traZODone (DESYREL) tablet 200 mg  200 mg Oral QHS Kerry Hough, PA   200 mg at 05/12/12 2117  . traZODone (DESYREL) tablet 50 mg  50 mg Oral QHS PRN Kerry Hough, PA      . DISCONTD: levofloxacin (LEVAQUIN) tablet 500 mg  500 mg Oral Daily Kerry Hough, PA   500 mg at 05/13/12 0855  . DISCONTD: naproxen (NAPROSYN) tablet 500 mg  500 mg Oral BID PRN Ronny Bacon, MD      . DISCONTD: ondansetron (ZOFRAN-ODT) disintegrating tablet 4 mg  4 mg Oral Q6H PRN Ronny Bacon, MD   4 mg at 05/12/12 0932    Lab Results:  Results for orders placed during the hospital encounter of 05/11/12 (from the past 48 hour(s))  TSH     Status: Normal   Collection Time   05/12/12  6:13 AM      Component Value Range Comment   TSH 1.387  0.350 - 4.500 uIU/mL     Physical Findings: AIMS: Facial and Oral Movements Muscles of Facial Expression: None, normal Lips and Perioral Area: None, normal Jaw: None, normal Tongue: None, normal,Extremity Movements Upper (arms, wrists, hands, fingers): None, normal Lower (legs, knees, ankles, toes): None, normal, Trunk Movements Neck, shoulders, hips: None, normal, Overall  Severity Severity of abnormal movements (highest score from questions above): None, normal Incapacitation due to abnormal movements: None, normal Patient's awareness of abnormal movements (rate only  patient's report): No Awareness, Dental Status Current problems with teeth and/or dentures?: No Does patient usually wear dentures?: No  CIWA:  CIWA-Ar Total: 8  COWS:  COWS Total Score: 10   Treatment Plan Summary: Daily contact with patient to assess and evaluate symptoms and progress in treatment Medication management  Plan: Discontinued Zofran, patient stated she is allergic to it. Instructed to come out of room to group counseling sessions and to the cafeteria for meals.  Continue current treatment plan.  Armandina Stammer I 05/13/2012, 12:16 PM

## 2012-05-13 NOTE — Progress Notes (Signed)
05/13/2012         Time: 0930      Group Topic/Focus: The focus of this group is on enhancing the patient's understanding of leisure, barriers to leisure, and the importance of engaging in positive leisure activities upon discharge for improved total health.  Participation Level: Did not attend  Participation Quality: Not Applicable  Affect: Not Applicable  Cognitive: Not Applicable   Additional Comments: Patient in bed, asleep, didn't respond to requests to attend group.   Patricia [name] 05/13/2012 11:48 AM

## 2012-05-13 NOTE — Progress Notes (Signed)
Psychoeducational Group Note  Date:  05/13/2012 Time:  1100  Group Topic/Focus:  Making Healthy Choices:   The focus of this group is to help patients identify negative/unhealthy choices they were using prior to admission and identify positive/healthier coping strategies to replace them upon discharge.  Participation Level: Did Not Attend  Participation Quality:  Not Applicable  Affect:  Not Applicable  Cognitive:  Not Applicable  Insight:  Not Applicable  Engagement in Group: Not Applicable  Additional Comments:  Pt did not attend group. In bed  Lavanna Rog A 05/13/2012, 1:03 PM

## 2012-05-13 NOTE — Progress Notes (Signed)
D:  Pt. About on unit today.  She says she is not feeling well.  She complains of withdrawal symptoms of feeling anxious, sweaty, and hand tremors.  She denies SI/HI and denies A/V hallucinations.  She has asked to take a bath this am and was allowed to do so.  She says that the baths help her anxiety.  A;  Pt offered support and encouragement.  Given medications as prescribed and prn for pain.  R:  Pt receptive to staff, has not attended groups.  Pt. Mother called to check on her.  Patient says she would like to explore long term treatment once she leaves here.  Remains on q 15 minute checks for safety.  Will continue to follow.

## 2012-05-14 LAB — URINE CULTURE
Colony Count: 15000
Special Requests: NORMAL

## 2012-05-14 MED ORDER — GABAPENTIN 400 MG PO CAPS
400.0000 mg | ORAL_CAPSULE | Freq: Four times a day (QID) | ORAL | Status: DC
  Administered 2012-05-14 – 2012-05-15 (×5): 400 mg via ORAL
  Filled 2012-05-14 (×11): qty 1

## 2012-05-14 NOTE — Progress Notes (Signed)
Psychoeducational Group Note  Date:  05/14/2012 Time:  1100  Group Topic/Focus:  Rediscovering Joy:   The focus of this group is to explore various ways to relieve stress in a positive manner.  Participation Level:  Minimal  Participation Quality:  Appropriate  Affect:  Appropriate  Cognitive:  Appropriate  Insight:  Limited  Engagement in Group:  Limited  Additional Comments:  Pt attended group but wasn't feeling well, as a result she couldn't participate fully.  Clifton Kovacic E 05/14/2012, 12:48 PM

## 2012-05-14 NOTE — Progress Notes (Signed)
[facility] Group Notes:  (Counselor/Nursing/MHT/Case Management/Adjunct)  05/14/2012 3:04 PM  Type of Therapy:  Group Therapy  9:30, Music Therapy 1:15  Participation Level:  Active  Participation Quality:  Attentive and Sharing  Affect:  Blunted  Cognitive:  Oriented  Insight:  Limited  Engagement in Group:  Good  Engagement in Therapy:  Good  Modes of Intervention:  Activity, Clarification, Education, Problem-solving, Support and Music  Summary of Progress/Problems:Patient attended both groups. She apologized for her behavior when she first got here. She is interested in long term treatment and wants this since she is being taken off valium. She is looking for something to address her bipolar but she was told that there was not a program just for bipolar. She is also interested in getting her disability and stated that disability is pending.   Patricia [name], Aram Beecham 05/14/2012, 3:04 PM

## 2012-05-14 NOTE — Progress Notes (Signed)
[name] MD Progress Note  05/14/2012 1:33 PM  Diagnosis:  Axis I: Schizoaffective Disorder - Depressed Type.  Polysubstance Dependence - Including Opioids, Amphetamines, Benzodiazepines.  Axis II: Deferred.  Axis III: 1. Urinary Tract Infection.  Axis IV: Chronic Mental Illness. Chronic Substance Abuse Issues.  Axis V: GAF at time of admission approximately 30. Highest GAF in the past year approximately 50.   The patient was seen today and reports the following:   ADL's: Intact.  Sleep: The patient reports to having ongoing difficulty initiating and maintaining sleep.  Appetite: The patient reports a that her appetite is improving.   Mild>(1-10) >Severe  Hopelessness (1-10): 9-10  Depression (1-10): 9-10  Anxiety (1-10): 9-10   Suicidal Ideation: The patient denies any suicidal ideations today.  Plan: No  Intent: No  Means: No   Homicidal Ideation: The patient denies any homicidal ideations today.  Plan: No  Intent: No.  Means: No   General Appearance/Behavior: The patient was cooperative today with this provider with less withdrawal symptoms today.  Eye Contact: Good.  Speech: Appropriate in rate and volume with no pressuring of speech noted today.  Motor Behavior: wnl.  Level of Consciousness: Alert and Oriented x 3.  Mental Status: Alert and Oriented x 3.  Mood: Severely Depressed.  Affect: Severely Constricted.  Anxiety Level: Severe anxiety reported today.  Thought Process: wnl.  Thought Content: The patient denies any current auditory or visual hallucinations or delusional thinking but states that she hears "noises." Perception: wnl.  Judgment: Fair.  Insight: Fair.  Cognition: Oriented to person, place and time.  Sleep:  Number of Hours: 6.5    Vital Signs:Blood pressure 82/57, pulse 156, temperature 97.5 F (36.4 C), temperature source Oral, resp. rate 18, height 5' 8.5" (1.74 m), weight 101.606 kg (224 lb), last menstrual period 04/27/2012.  Current  Medications: Current Facility-Administered Medications  Medication Dose Route Frequency Provider Last Rate Last Dose  . acetaminophen (TYLENOL) tablet 650 mg  650 mg Oral Q6H PRN Kerry Hough, PA   650 mg at 05/14/12 0901  . alum & mag hydroxide-simeth (MAALOX/MYLANTA) 200-200-20 MG/5ML suspension 30 mL  30 mL Oral Q4H PRN Curlene Labrum Readling, MD   30 mL at 05/13/12 1946  . chlordiazePOXIDE (LIBRIUM) capsule 25 mg  25 mg Oral Q6H PRN Ronny Bacon, MD   25 mg at 05/13/12 2131  . chlordiazePOXIDE (LIBRIUM) capsule 25 mg  25 mg Oral TID Curlene Labrum Readling, MD   25 mg at 05/13/12 1659   Followed by  . chlordiazePOXIDE (LIBRIUM) capsule 25 mg  25 mg Oral BH-qamhs Randy D Readling, MD   25 mg at 05/14/12 0759   Followed by  . chlordiazePOXIDE (LIBRIUM) capsule 25 mg  25 mg Oral Daily Curlene Labrum Readling, MD      . ciprofloxacin (CIPRO) tablet 500 mg  500 mg Oral BID Curlene Labrum Readling, MD   500 mg at 05/14/12 0759  . cloNIDine (CATAPRES) tablet 0.1 mg  0.1 mg Oral QID Curlene Labrum Readling, MD   0.1 mg at 05/13/12 2131   Followed by  . cloNIDine (CATAPRES) tablet 0.1 mg  0.1 mg Oral BH-qamhs Randy D Readling, MD   0.1 mg at 05/14/12 0759   Followed by  . cloNIDine (CATAPRES) tablet 0.1 mg  0.1 mg Oral QAC breakfast Curlene Labrum Readling, MD      . dicyclomine (BENTYL) tablet 20 mg  20 mg Oral Q6H PRN Ronny Bacon, MD      .  escitalopram (LEXAPRO) tablet 20 mg  20 mg Oral Daily Kerry Hough, PA   20 mg at 05/14/12 0759  . gabapentin (NEURONTIN) capsule 400 mg  400 mg Oral QID Curlene Labrum Readling, MD      . hydrochlorothiazide (HYDRODIURIL) tablet 25 mg  25 mg Oral Daily Kerry Hough, PA   25 mg at 05/14/12 0758  . hydrOXYzine (ATARAX/VISTARIL) tablet 25 mg  25 mg Oral Q6H PRN Curlene Labrum Readling, MD   25 mg at 05/14/12 0801  . iloperidone (FANAPT) tablet 2 mg  2 mg Oral BID Kerry Hough, PA   2 mg at 05/14/12 0759  . loperamide (IMODIUM) capsule 2-4 mg  2-4 mg Oral PRN Curlene Labrum Readling, MD      .  magnesium hydroxide (MILK OF MAGNESIA) suspension 30 mL  30 mL Oral Daily PRN Kerry Hough, PA      . methocarbamol (ROBAXIN) tablet 500 mg  500 mg Oral Q8H PRN Curlene Labrum Readling, MD   500 mg at 05/13/12 1450  . multivitamin with minerals tablet 1 tablet  1 tablet Oral Daily Ronny Bacon, MD   1 tablet at 05/14/12 0759  . pantoprazole (PROTONIX) EC tablet 40 mg  40 mg Oral QHS Curlene Labrum Readling, MD   40 mg at 05/13/12 2114  . potassium chloride SA (K-DUR,KLOR-CON) CR tablet 20 mEq  20 mEq Oral Daily Kerry Hough, PA   20 mEq at 05/14/12 0800  . thiamine (VITAMIN B-1) tablet 100 mg  100 mg Oral Daily Curlene Labrum Readling, MD   100 mg at 05/14/12 0800  . traZODone (DESYREL) tablet 200 mg  200 mg Oral QHS Kerry Hough, PA   200 mg at 05/13/12 2114  . traZODone (DESYREL) tablet 50 mg  50 mg Oral QHS PRN Kerry Hough, PA      . DISCONTD: gabapentin (NEURONTIN) capsule 300 mg  300 mg Oral QID Kerry Hough, PA   300 mg at 05/14/12 1146   Lab Results: No results found for this or any previous visit (from the past 48 hour(s)).  Physical Findings: AIMS: Facial and Oral Movements Muscles of Facial Expression: None, normal Lips and Perioral Area: None, normal Jaw: None, normal Tongue: None, normal,Extremity Movements Upper (arms, wrists, hands, fingers): None, normal Lower (legs, knees, ankles, toes): None, normal, Trunk Movements Neck, shoulders, hips: None, normal, Overall Severity Severity of abnormal movements (highest score from questions above): None, normal Incapacitation due to abnormal movements: None, normal Patient's awareness of abnormal movements (rate only patient's report): No Awareness, Dental Status Current problems with teeth and/or dentures?: No Does patient usually wear dentures?: No  CIWA:  CIWA-Ar Total: 3  COWS:  COWS Total Score: 4   Review of Systems:  Neurological: No headaches, seizures or dizziness reported.  G.I.: The patient reports significant G.I. Upset  today.  Musculoskeletal: The patient denies any musculoskeletal issues today.   Time was spent today discussing with the patient her current symptoms. The patient states that she is having ongoing difficulty initiating and maintaining sleep and reports a decreased appetite which is improving.  She reports ongoing severe feelings of sadness, anhedonia and depressed mood but denies any current suicidal or homicidal ideations. She denies any auditory or visual hallucinations or delusional thinking but states she is hearing "noises" at times.  She continues to report severe anxiety symptoms.   The patient denies any medication related side effects and reports that her withdrawal symptoms are  improving.  The patient also voiced a desire today to go to a longer term treatment facility at discharge.  Treatment Plan Summary:  1. Daily contact with patient to assess and evaluate symptoms and progress in treatment.  2. Medication management  3. The patient will deny suicidal ideations or homicidal ideations for 48 hours prior to discharge and have a depression and anxiety rating of 3 or less. The patient will also deny any auditory or visual hallucinations or delusional thinking.  4. The patient will deny any symptoms of substance withdrawal at time of discharge.   Plan:  1. Will continue the patient on her current medications as listed above.  2. Will place the patient on a Librium Protocol for detox from alcohol dependence.  3. Will place the patient on a Clonidine Protocol for detox from her opioid dependence.  4. Will increase the medication Neurontin to 400 mgs po q 8 am, 2 pm and hs for anxiety. 5. Laboratory Studies reviewed.  6. Will continue to monitor.   RANDY READLING 05/14/2012, 1:33 PM

## 2012-05-14 NOTE — Progress Notes (Signed)
D:  Pt about on the unit today.  She says she is feeling better today versus yesterday.  She denies SI/HI.  Says she hears "noises" in her head.  Says that she wants to go to long term treatment once she leaves here and asks appropriate questions about this.  She has CIWA of 3 and COWS of 4.  She has attended all programming on the unit today.  A: Offered support and encouragement.  Given medications as prescribed and prns for anxiety,  menses pain and muscle spasms.  R:  Pt receptive to staff, Remains on q 15 minute checks for safety.  Will continue to monitor.

## 2012-05-14 NOTE — Progress Notes (Signed)
Adult Services Patient-Family Contact/Session  Attendees:  Patient's boyfriend, [name] [phone])                      Late entry:05/12/12 Goal(s): collateral  Safety Concerns:  none  Narrative:  Boyfriend talked about patient being out of her medications for 3 days (neurotin, valium). He stated he can tell when she needs help because her eyes change. She even appeared to be dehydrated and white foam was coming from her mouth. When asked if patient could have withdrawal from anything else. 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 He stated that she is very manipulative and that he can't believe her. He stated that she over takes her medications. He stated that she goes to [place] where he mother lives and gets all kinds of drugs. He mentioned that she was in [facility] a year ago. At [facility] they would not give her the prescription for valium. He stated that he has been administering her meds but he still thinks she takes too much.  Barrier(s):  None    Interventions:  Collateral  Recommendation(s):   Inpatient for detox and stabilization of symptoms    Follow-up Required:  No  Explanation:    Veto Kemps 05/14/2012, 2:48 PM

## 2012-05-14 NOTE — [facility] Counselor (Signed)
Adult Comprehensive Assessment  Patient ID: Patricia [name], female   DOB: Jun 13, 1976, 36 y.o.   MRN: [id number]  Information Source: Information source: Patient  Current Stressors:  Educational / Learning stressors: no issues reported Employment / Job issues: unemployed Family Relationships: no issues reported Housing / Lack of housing: lives with boyfriend Physical health (include injuries & life threatening diseases): currently in a lot of pain and discomfort, exxhibiting withdrawal symptoms Social relationships: conflict with boyfriend due to her use of substances  Substance abuse: cocaine,  Bereavement / Loss: no issues reported  Living/Environment/Situation:  Living Arrangements: Other (Comment) (boy friend) Living conditions (as described by patient or guardian): boyfriend How long has patient lived in current situation?: 3 years What is atmosphere in current home: Comfortable  Family History:  Marital status: Long term relationship Long term relationship, how long?: 3 years What types of issues is patient dealing with in the relationship?: denied any issues Does patient have children?: Yes How many children?: 87  (36 year old daughter, 65 and 41 year old son) How is patient's relationship with their children?: they live with their father  Childhood History:  By whom was/is the patient raised?: Mother (and grandmother) Description of patient's relationship with caregiver when they were a child: good Patient's description of current relationship with people who raised him/her: conflict with her mother, only out for something Does patient have siblings?: No Did patient suffer any verbal/emotional/physical/sexual abuse as a child?: No Did patient suffer from severe childhood neglect?: No Has patient ever been sexually abused/assaulted/raped as an adolescent or adult?: No Was the patient ever a victim of a crime or a disaster?: No Witnessed domestic violence?: No Has patient  been effected by domestic violence as an adult?: No  Education:  Highest grade of school patient has completed: 4 years of college - [employer] fundraiser and EMS paramedic Currently a student?: No Learning disability?: No  Employment/Work Situation:   Employment situation: Unemployed Patient's job has been impacted by current illness: No What is the longest time patient has a held a job?: [phone] Where was the patient employed at that time?: contract work as a Engineer, 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 Has patient ever been in the [employer]?: No Has patient ever served in Buyer, retail?: No  Financial Resources:   Financial resources:  (dependent on boyfriend, has disability pending) Does patient have a Lawyer or guardian?: No  Alcohol/Substance Abuse:   What has been your use of drugs/alcohol within the last 12 months?: cocaine-over the weekend (sniffed it), denies any other use but in withdrawal If attempted suicide, did drugs/alcohol play a role in this?: No Alcohol/Substance Abuse Treatment Hx: Denies past history Has alcohol/substance abuse ever caused legal problems?: No  Social Support System:   Conservation officer, nature Support System: Good Describe Community Support System: mother, boyfriend Type of faith/religion: none How does patient's faith help to cope with current illness?: n/a  Leisure/Recreation:   Leisure and Hobbies: listening to music  Strengths/Needs:   What things does the patient do well?: nothing In what areas does patient struggle / problems for patient: ?  Discharge Plan:   Does patient have access to transportation?: Yes (boyfriend) Will patient be returning to same living situation after discharge?: Yes Currently receiving community mental health services: Yes (From Whom) If no, would patient like referral for services when discharged?:  Vesta Mixer)  Summary/Recommendations:   Summary and Recommendations (to be completed by the evaluator): Patient is a 36 year old white  female with diagnosis  of Schizoaffective Disorder. She was loud and out of control in ED. She has been off meds for 3 days and complained of hearing voices. During initial part of assessment, patient was in a lot of pain and discomfort and stated that she had been taken off all her valium. She was crying and howling uncontrollably. During assessment today patient is clearer and asking for long term treatment. Continues to deny problem with substances and wants something that will help her if she can't have her valium. Patient would benefit from crisis stabilization, medication evaluation, group therapy and psychoeducation groups to work on coping skills, case management for referrals and counselor to contact family for collateral.  Charleene Callegari, Aram Beecham. 05/14/2012

## 2012-05-14 NOTE — Tx Team (Addendum)
Interdisciplinary Treatment Plan Update (Adult)  Date: 05/14/2012  Time Reviewed: 10:59 AM   Progress in Treatment: Attending groups: Yes Participating in groups:  Yes Taking medication as prescribed: Yes Tolerating medication:  Yes Family/Significant othe contact made:  yes, boyfriend Patient understands diagnosis:  Yes Discussing patient identified problems/goals with staff:  No Medical problems stabilized or resolved:  Yes Denies suicidal/homicidal ideation: Yes Issues/concerns per patient self-inventory:  None identified Other: N/A  New problem(s) identified: None Identified  Reason for Continuation of Hospitalization: Anxiety Depression Medication stabilization Withdrawal symptoms   Interventions implemented related to continuation of hospitalization: mood stabilization, medication monitoring and adjustment, group therapy and psycho education, safety checks q 15 mins  Additional comments: Patient on Librium and Clonidine Protocol; and Neurontin for anxiety.  Estimated length of stay: 2-3 days  Discharge Plan: Exploring [facility] and [facility].  New goal(s): N/A  Review of initial/current patient goals per problem list:   1.  Goal(s): Reduce depressive symptoms  Met:  No, depression 9-10  Target date: by discharge  As evidenced by: Reducing depression from a 10 to a 3 as reported by pt.   2.  Goal (s): Eliminate Suicidal Ideation  Met:  No  Target date: by discharge  As evidenced by: Eliminate suicidal ideation.   3.  Goal(s): Reduce Psychosis  Met:  No, patient continues to hear voices.  Target date: by discharge  As evidenced by: Reduce psychotic symptoms to baseline, as reported by pt.     Attendees: Patient:  Patricia [name] 05/14/2012 10:53 AM   Family:     Physician:  Franchot Gallo, MD 05/14/2012 10:53 AM   Nursing:      Case Manager:  Barrie Folk RN MS EdS 05/14/2012 10:53 AM   Counselor:  Veto Kemps, MT-BC 05/14/2012 10:54 AM   Other:   Abbie Sons RN 05/14/2012 10:55 AM   Other:  Joslyn Devon RN 10:55 AM 05/14/2012   Other:     Other:       Scribe for Treatment Team:   [facility] 05/14/2012 10:55 AM

## 2012-05-14 NOTE — Care Management (Signed)
Patient attended after-care planning group this AM. Her thought processes were not clear at times and she had difficulty following the topic of Life Style and Leisure and how that could improved through rediscovering joy, overcoming stress and improving self-esteem. Patient was able to communicate that she was eager for discharge to see her children that are in the custody of their father. Patient vague regarding substance use. Writer is in the process of referring patient to [facility] for SA treatment. Joice Lofts RN MS EdS 05/14/2012  4:36 PM

## 2012-05-15 MED ORDER — HYDROXYZINE HCL 50 MG PO TABS
50.0000 mg | ORAL_TABLET | Freq: Four times a day (QID) | ORAL | Status: DC | PRN
  Administered 2012-05-15: 50 mg via ORAL
  Filled 2012-05-15: qty 1

## 2012-05-15 MED ORDER — QUETIAPINE FUMARATE 50 MG PO TABS: 50.0000 mg | ORAL_TABLET | Freq: Four times a day (QID) | ORAL | Status: DC | PRN

## 2012-05-15 MED ORDER — GABAPENTIN 300 MG PO CAPS
600.0000 mg | ORAL_CAPSULE | Freq: Three times a day (TID) | ORAL | Status: DC
  Filled 2012-05-15 (×3): qty 2

## 2012-05-15 MED ORDER — GABAPENTIN 300 MG PO CAPS
600.0000 mg | ORAL_CAPSULE | Freq: Three times a day (TID) | ORAL | Status: DC
  Administered 2012-05-15 – 2012-05-20 (×19): 600 mg via ORAL
  Filled 2012-05-15 (×18): qty 2
  Filled 2012-05-15: qty 1
  Filled 2012-05-15 (×3): qty 2
  Filled 2012-05-15: qty 1
  Filled 2012-05-15 (×3): qty 2

## 2012-05-15 MED ORDER — GABAPENTIN 600 MG PO TABS: 600.0000 mg | ORAL_TABLET | Freq: Three times a day (TID) | ORAL | Status: DC

## 2012-05-15 NOTE — Progress Notes (Signed)
Pt's affect is sad and anxious. She reports feeling sleepy and having voices during the night that she could not describe. Pt has isolative behavior with improved hygiene. Monitored q 15 minute checks. Encouraged pt to discuss feelings with staff and attend groups. Medication administered per MD order. Gave scheduled librium per consult with PA and increased pulse. Encouraged pt to increase fluids. Pt is resting in room and taking medication as ordered. She is tolerating medications well with no side effects. She is able to verbalize reason for medications and side effects. She denies si and hi. Safety maintained on unit.

## 2012-05-15 NOTE — Progress Notes (Signed)
[facility] MD Progress Note  05/15/2012 5:08 PM  Diagnosis:  Axis I:   Schizoaffective Disorder - Depressed Type.    Polysubstance Dependence - Including Opioids, Amphetamines, Benzodiazepines.  Axis II:  Deferred.  Axis III:  1. Urinary Tract Infection.  Axis IV:  Chronic Mental Illness. Chronic Substance Abuse Issues.  Axis V:  GAF at time of admission approximately 30. Highest GAF in the past year approximately 50.   The patient was seen today and reports the following:   ADL's: Intact.  Sleep: The patient reports to having ongoing difficulty initiating and maintaining sleep but according to staff she slept 6.75 hours last night. Appetite: The patient reports that her appetite decreased but improving.   Mild>(1-10) >Severe  Hopelessness (1-10): 5  Depression (1-10): 6-7  Anxiety (1-10): 8-9 (when I take Librium it is decreased to 4-5)   Suicidal Ideation: The patient denies any suicidal ideations today.  Plan: No  Intent: No  Means: No   Homicidal Ideation: The patient denies any homicidal ideations today.  Plan: No  Intent: No.  Means: No   General Appearance/Behavior: The patient was cooperative today with this provider and appeared to be less anxious.  Eye Contact: Good.  Speech: Appropriate in rate and volume with no pressuring of speech noted today.  Motor Behavior: wnl.  Level of Consciousness: Alert and Oriented x 3.  Mental Status: Alert and Oriented x 3.  Mood: Moderately Depressed.  Affect: Moderately Constricted.  Anxiety Level: Severe anxiety once again reported today.  Thought Process: wnl.  Thought Content: The patient denies any current auditory or visual hallucinations or delusional thinking but states that she continues to hear "noises."  Perception: wnl.  Judgment: Fair.  Insight: Fair.  Cognition: Oriented to person, place and time.  Sleep:  Number of Hours: 6.75    Vital Signs:Blood pressure 108/69, pulse 83, temperature 97.1 F (36.2 C), temperature  source Oral, resp. rate 12, height 5' 8.5" (1.74 m), weight 101.606 kg (224 lb), last menstrual period 04/27/2012.  Current Medications: Current Facility-Administered Medications  Medication Dose Route Frequency Provider Last Rate Last Dose  . acetaminophen (TYLENOL) tablet 650 mg  650 mg Oral Q6H PRN Kerry Hough, PA   650 mg at 05/15/12 9604  . alum & mag hydroxide-simeth (MAALOX/MYLANTA) 200-200-20 MG/5ML suspension 30 mL  30 mL Oral Q4H PRN Curlene Labrum Readling, MD   30 mL at 05/13/12 1946  . chlordiazePOXIDE (LIBRIUM) capsule 25 mg  25 mg Oral Q6H PRN Ronny Bacon, MD   25 mg at 05/15/12 5409  . chlordiazePOXIDE (LIBRIUM) capsule 25 mg  25 mg Oral BH-qamhs Randy D Readling, MD   25 mg at 05/14/12 2127   Followed by  . chlordiazePOXIDE (LIBRIUM) capsule 25 mg  25 mg Oral Daily Curlene Labrum Readling, MD   25 mg at 05/15/12 0827  . ciprofloxacin (CIPRO) tablet 500 mg  500 mg Oral BID Curlene Labrum Readling, MD   500 mg at 05/15/12 0804  . cloNIDine (CATAPRES) tablet 0.1 mg  0.1 mg Oral BH-qamhs Randy D Readling, MD   0.1 mg at 05/15/12 8119   Followed by  . cloNIDine (CATAPRES) tablet 0.1 mg  0.1 mg Oral QAC breakfast Curlene Labrum Readling, MD      . dicyclomine (BENTYL) tablet 20 mg  20 mg Oral Q6H PRN Curlene Labrum Readling, MD      . escitalopram (LEXAPRO) tablet 20 mg  20 mg Oral Daily Kerry Hough, PA   20  mg at 05/15/12 0807  . gabapentin (NEURONTIN) tablet 600 mg  600 mg Oral TID WC & HS Randy D Readling, MD      . hydrochlorothiazide (HYDRODIURIL) tablet 25 mg  25 mg Oral Daily Kerry Hough, PA   25 mg at 05/15/12 1914  . hydrOXYzine (ATARAX/VISTARIL) tablet 25 mg  25 mg Oral Q6H PRN Curlene Labrum Readling, MD   25 mg at 05/14/12 1401  . iloperidone (FANAPT) tablet 2 mg  2 mg Oral BID Kerry Hough, PA   2 mg at 05/15/12 7829  . loperamide (IMODIUM) capsule 2-4 mg  2-4 mg Oral PRN Curlene Labrum Readling, MD      . magnesium hydroxide (MILK OF MAGNESIA) suspension 30 mL  30 mL Oral Daily PRN Kerry Hough,  PA      . methocarbamol (ROBAXIN) tablet 500 mg  500 mg Oral Q8H PRN Curlene Labrum Readling, MD   500 mg at 05/15/12 1136  . multivitamin with minerals tablet 1 tablet  1 tablet Oral Daily Ronny Bacon, MD   1 tablet at 05/15/12 0806  . pantoprazole (PROTONIX) EC tablet 40 mg  40 mg Oral QHS Curlene Labrum Readling, MD   40 mg at 05/14/12 2127  . potassium chloride SA (K-DUR,KLOR-CON) CR tablet 20 mEq  20 mEq Oral Daily Kerry Hough, PA   20 mEq at 05/15/12 0806  . thiamine (VITAMIN B-1) tablet 100 mg  100 mg Oral Daily Curlene Labrum Readling, MD   100 mg at 05/15/12 0806  . traZODone (DESYREL) tablet 200 mg  200 mg Oral QHS Kerry Hough, PA   200 mg at 05/14/12 2129  . traZODone (DESYREL) tablet 50 mg  50 mg Oral QHS PRN Kerry Hough, PA      . DISCONTD: gabapentin (NEURONTIN) capsule 400 mg  400 mg Oral QID Curlene Labrum Readling, MD   400 mg at 05/15/12 1136  . DISCONTD: hydrOXYzine (ATARAX/VISTARIL) tablet 50 mg  50 mg Oral Q6H PRN Curlene Labrum Readling, MD   50 mg at 05/15/12 1434  . DISCONTD: QUEtiapine (SEROQUEL) tablet 50 mg  50 mg Oral Q6H PRN Ronny Bacon, MD       Lab Results: No results found for this or any previous visit (from the past 48 hour(s)).  Physical Findings: AIMS: Facial and Oral Movements Muscles of Facial Expression: None, normal Lips and Perioral Area: None, normal Jaw: None, normal Tongue: None, normal,Extremity Movements Upper (arms, wrists, hands, fingers): None, normal Lower (legs, knees, ankles, toes): None, normal, Trunk Movements Neck, shoulders, hips: None, normal, Overall Severity Severity of abnormal movements (highest score from questions above): None, normal Incapacitation due to abnormal movements: None, normal Patient's awareness of abnormal movements (rate only patient's report): No Awareness, Dental Status Current problems with teeth and/or dentures?: No Does patient usually wear dentures?: No  CIWA:  CIWA-Ar Total: 1  COWS:  COWS Total Score: 3   Review  of Systems:  Neurological: No headaches, seizures or dizziness reported.  G.I.: The patient reports significant G.I. Upset today.  Musculoskeletal: The patient denies any musculoskeletal issues today.   Time was spent today discussing with the patient her current symptoms. The patient states that she is having ongoing difficulty initiating and maintaining sleep but according to staff she slept approximately 6.75 hours last night.  The patient once again reports a decreased appetite which is improving.  She reports ongoing moderate feelings of sadness, anhedonia and depressed mood but denies any  suicidal or homicidal ideations. She denies any auditory or visual hallucinations or delusional thinking but states she continues to hear "noises" at times. She continues to report severe anxiety symptoms which she states is helped with Librium.  The patient denies any medication related side effects and reports that her withdrawal symptoms are improving.   Treatment Plan Summary:  1. Daily contact with patient to assess and evaluate symptoms and progress in treatment.  2. Medication management  3. The patient will deny suicidal ideations or homicidal ideations for 48 hours prior to discharge and have a depression and anxiety rating of 3 or less. The patient will also deny any auditory or visual hallucinations or delusional thinking.  4. The patient will deny any symptoms of substance withdrawal at time of discharge.   Plan:  1. Will continue the patient on the medication Lexapro 20 mgs po q am for depression and anxiety. 2. Will increase the medication Neurontin to 600 mgs po TID - AC and hs for anxiety. 3. Will continue the patient on the medication Trazodone at 200 mgs po qhs for sleep.  4. Will continue the patient on a Librium Protocol for detox from alcohol dependence.  5. Will continue the patient on a Clonidine Protocol for detox from her opioid dependence.  6. Will continue the patient on her  non-psychiatric medications as listed above. 7. Laboratory Studies reviewed.  8. Will continue to monitor.    RANDY READLING 05/15/2012, 5:09 PM

## 2012-05-15 NOTE — Progress Notes (Signed)
Psychoeducational Group Note  Date:  05/15/2012 Time:  1100  Group Topic/Focus:  Relapse Prevention Planning:   The focus of this group is to define relapse and discuss the need for planning to combat relapse.  Participation Level:  Active  Participation Quality:  Appropriate, Sharing and Supportive  Affect:  Appropriate  Cognitive:  Appropriate  Insight:  Good  Engagement in Group:  Good  Additional Comments: none  Tatiyanna Lashley M 05/15/2012, 1:34 PM

## 2012-05-15 NOTE — Care Management (Signed)
Patient did not attend after-care planning group this AM. She did attend treatment team and seemed less anxious and exhibited clearer thoughts. She is agreeable to referral to [facility] for SA treatment. Boyfriend called and was appreciative of the treatment and discharge plan. Joice Lofts RN MS EdS 05/15/2012  12:31 PM

## 2012-05-15 NOTE — Progress Notes (Signed)
[facility] Group Notes:  (Counselor/Nursing/MHT/Case Management/Adjunct)  05/15/2012 2:09 PM  Type of Therapy:  Group Therapy  Participation Level:  Active  Participation Quality:  Appropriate  Affect:  Appropriate  Cognitive:  Appropriate  Insight:  Limited  Engagement in Group:  Good  Engagement in Therapy:  Good  Modes of Intervention:  Education, Problem-solving and Support  Summary of Progress/Problems: Patient was asking a lot of questions about [facility] and what to expect. She talked about her episode of "flipping out" and a lot of times this happens because she stops taking her medications because she feels good. Questioned why they change her medications so much on the outside.    Aedan Geimer, Aram Beecham 05/15/2012, 2:09 PM

## 2012-05-15 NOTE — Progress Notes (Signed)
05/15/2012         Time: 0930      Group Topic/Focus: The focus of the group is on enhancing the patients' ability to utilize positive relaxation strategies by practicing several that can be used at discharge.  Participation Level: Did not attend  Participation Quality: Not Applicable  Affect: Not Applicable  Cognitive: Not Applicable   Additional Comments: Patient refused group.    Patricia [name] 05/15/2012 10:21 AM  

## 2012-05-15 NOTE — Progress Notes (Signed)
Patient ID: Patricia [name], female   DOB: Mar 20, 1976, 36 y.o.   MRN: [id number] D) Attended karaoke this evening, but came to med window on return and asked for something for anxiety, stated she had felt anxious and agitated, felt like her skin was crawling.  Took her meds without issue, stated still come discomfort with urination but feels UTI is improving.  Affect was flat and anxious, sad, went to her room after she had a snack and meds, went to bed.   Denied thoughts of self harm, denied voices.  A) Will continue to monitor for safety, continue POC, and offer support and encouragement as she struggles with detox.  R) appreciative.

## 2012-05-15 NOTE — Progress Notes (Signed)
[facility] Group Notes:  (Counselor/Nursing/MHT/Case Management/Adjunct)  05/15/2012 9:19 PM  Type of Therapy:  Psychoeducational Skills  Participation Level:  Active  Participation Quality:  Appropriate  Affect:  Appropriate  Cognitive:  Appropriate  Insight:  Limited  Engagement in Group:  Good  Engagement in Therapy:  Good  Modes of Intervention:  Education  Summary of Progress/Problems: Pt. States that her goal for tomorrow is to "go to more groups". She would not elaborate on how her day went.    Hazle Coca S 05/15/2012, 9:19 PM

## 2012-05-15 NOTE — Tx Team (Signed)
Interdisciplinary Treatment Plan Update (Adult)  Date: 05/15/2012  Time Reviewed: 11:04 AM   Progress in Treatment: Attending groups: Yes Participating in groups:  Yes Taking medication as prescribed: Yes Tolerating medication:  Yes Family/Significant othe contact made:  With boyfriend. Patient understands diagnosis:  Yes Discussing patient identified problems/goals with staff:  Yes Medical problems stabilized or resolved:  Yes Denies suicidal/homicidal ideation: Yes Issues/concerns per patient self-inventory:  None identified Other: N/A  New problem(s) identified: None Identified  Reason for Continuation of Hospitalization: Anxiety Depression Hallucinations Medication stabilization Withdrawal symptoms   Interventions implemented related to continuation of hospitalization: mood stabilization, medication monitoring and adjustment, group therapy and psycho education, safety checks q 15 mins  Additional comments: N/A  Estimated length of stay:   Discharge Plan: Referral to [facility] made. Spoke with boyfriend and he states that she can stay with him if she is discharged prior to bed available at [facility].  New goal(s): N/A  Review of initial/current patient goals per problem list:   1.  Goal(s): Reduce depressive symptoms  Met:  No, depression rated at 6-7/10  Target date: by discharge  As evidenced by: Reducing depression from a 10 to a 3 as reported by pt.   2.  Goal (s): Eliminate Suicidal Ideation  Met:  Yes  As evidenced by: Eliminate suicidal ideation.   3.  Goal(s): Reduce Psychosis  Met:  No, voices still remain  Target date: by discharge  As evidenced by: Reduce psychotic symptoms to baseline, as reported by pt.      Attendees: Patient:  Patricia [name] 05/15/2012 11:01 AM   Family:     Physician:  Franchot Gallo, MD 05/15/2012 11:01 AM   Nursing:      Case Manager:  Barrie Folk RN MS EdS 05/15/2012 11:01 AM   Counselor:  Veto Kemps, MT-BC 05/15/2012  11:01 AM   Other:  Waynetta Sandy RN 05/15/2012 11:01 AM   Other:     Other:     Other:      Scribe for Treatment Team:   Barrie Folk RN 05/15/2012 11:01 AM

## 2012-05-15 NOTE — Progress Notes (Signed)
Patient has been in the dayroom watching, no interaction with peers, Clinical research associate spoke with patient and she reports that her day has been good. Patient c/o back pain from her menstrual cycle and indigestion she request tylenol and maalox which was given. Patient denies si/hi/visual hall. Patient reports hearing sounds but can't describe it. Support and encouragement offered, safety maintained on unit, will continue to monitor.

## 2012-05-15 NOTE — Progress Notes (Signed)
Psychoeducational Group Note  Date:  05/15/2012 Time: 2000  Group Topic/Focus:  Karaoke   Participation Level:  Minimal  Participation Quality:  Appropriate  Affect:  Appropriate   Cognitive:  Appropriate  Insight:  Good  Engagement in Group:  Limited  Additional Comments:  Pt attended karaoke this evening, pt participated in group.   Samreet Edenfield A 05/15/2012, 1:03 AM

## 2012-05-16 DIAGNOSIS — F1320 Sedative, hypnotic or anxiolytic dependence, uncomplicated: Secondary | ICD-10-CM

## 2012-05-16 DIAGNOSIS — N390 Urinary tract infection, site not specified: Secondary | ICD-10-CM | POA: Diagnosis present

## 2012-05-16 MED ORDER — CHLORDIAZEPOXIDE HCL 25 MG PO CAPS
25.0000 mg | ORAL_CAPSULE | Freq: Two times a day (BID) | ORAL | Status: AC
  Administered 2012-05-16 – 2012-05-17 (×2): 25 mg via ORAL
  Filled 2012-05-16 (×2): qty 1

## 2012-05-16 MED ORDER — ILOPERIDONE 4 MG PO TABS
4.0000 mg | ORAL_TABLET | Freq: Two times a day (BID) | ORAL | Status: DC
  Administered 2012-05-16 – 2012-05-18 (×4): 4 mg via ORAL
  Filled 2012-05-16 (×8): qty 1

## 2012-05-16 MED ORDER — CHLORDIAZEPOXIDE HCL 25 MG PO CAPS
25.0000 mg | ORAL_CAPSULE | Freq: Three times a day (TID) | ORAL | Status: AC
  Administered 2012-05-16: 25 mg via ORAL
  Filled 2012-05-16: qty 1

## 2012-05-16 MED ORDER — CHLORDIAZEPOXIDE HCL 25 MG PO CAPS
25.0000 mg | ORAL_CAPSULE | Freq: Once | ORAL | Status: AC
  Administered 2012-05-17: 25 mg via ORAL
  Filled 2012-05-16: qty 1

## 2012-05-16 NOTE — Progress Notes (Signed)
[facility] MD Progress Note                                         05/16/2012    Patricia [name] Nov 14, 1975    [id number] Hospital day #5  1. Schizoaffective disorder, bipolar type   2. Polysubstance dependence including opioid type drug, continuous use   3. Schizoaffective disorder, depressive type   4. Schizoaffective disorder, unspecified condition   5. Combinations of opioid type drug with any other drug dependence, continuous    The patient was seen today and reports the following:  Sleep: poor Appetite: 'getting better"  Mild (1-10) Severe  Depression (1-10): 8 Anxiety (1-10): 10 "out of control" Hopelessness (1-10):  9  Suicidal Ideation: The patient denies suicidal ideation. Plan: None Intent: None Means: None  Homicidal Ideation: The patient denies homicidal ideation. Plan: None Intent: None Means: None  Eye Contact:  good General Appearance:  casual Behavior:  Cooperative  Motor Behavior:  tremulous Speech: clear  Mental Status:  Orientation x 3 Level of Consciousness:   alert Mood: extreme anxiety Affect: anxious depressed   Thought Process: linear Thought Content:  Few auditory hallucinations Perception: intact  Judgment: fair Insight: present Cognition: at least average  VS: height is 5' 8.5" (1.74 m) and weight is 101.606 kg (224 lb). Her oral temperature is 98 F (36.7 C). Her blood pressure is 85/52 and her pulse is 87. Her respiration is 18.  Current Medication:   . chlordiazePOXIDE  25 mg Oral TID   Followed by  . chlordiazePOXIDE  25 mg Oral BID   Followed by  . chlordiazePOXIDE  25 mg Oral Once  . ciprofloxacin  500 mg Oral BID  . cloNIDine  0.1 mg Oral BH-qamhs   Followed by  . cloNIDine  0.1 mg Oral QAC breakfast  . escitalopram  20 mg Oral Daily  . gabapentin  600 mg Oral TID AC & HS  . hydrochlorothiazide  25 mg Oral Daily  . iloperidone  4 mg Oral BID  . multivitamin with minerals  1 tablet Oral Daily  . pantoprazole  40 mg  Oral QHS  . potassium chloride  20 mEq Oral Daily  . thiamine  100 mg Oral Daily  . traZODone  200 mg Oral QHS  . DISCONTD: gabapentin  400 mg Oral QID  . DISCONTD: gabapentin  600 mg Oral TID AC, HS, 0200  . DISCONTD: gabapentin  600 mg Oral TID WC & HS  . DISCONTD: iloperidone  2 mg Oral BID    Lab results:   No results found for this or any previous visit for  Last 48 hours. UDS from admission reviewed and is + for benzos, cocaine, and THC. Group attendance: ROS:    Constitutional: WDWN Adult in mild distress   GI: Negative for N,V,D,C   Neuro: Negative for dizziness, blurred vision, visual changes, headaches   Resp: Negative for wheezing, SOB, cough   Cardio: Negative for CP, diaphoresis, fatigue   MSK: Negative for joint pain, swelling, DROM, or ambulatory difficulties.  Time was spent with the patient discussing the current symptoms and the response to treatment. Barbera reports extreme anxiety and is asking for medication for anxiety. She reports she was taking valium 10mg  TID from her PCP for years prior to coming to [facility]. She also acknowledges using cocaine as well prior to admission. She also notes that she  does not feel that her Gabapentin is working at all now. [place] is reporting symptoms of tremors, restlessness, and irritability. She also reports that her blood pressure has been so low that she has missed several doses of her clonidine.  Treatment Summary: 1. Continue crisis management and stabilization. 2. Medication management to reduce current symptoms to base line and improve the patient's overall level of functioning 3. Treat health problems as indicated. 4. Develop treatment plan to decrease risk of relapse upon discharge and the need for     readmission. 5. Psycho-social education regarding relapse prevention and self care. 6. Health care follow up as needed for medical problems. 7. Restart home medications where appropriate.   Treatment Plan:  Plan:  1. Will  continue the patient on the medication Lexapro 20 mgs po q am for depression and anxiety.  2. Will continue the medication Neurontin to 600 mgs po TID - AC and hs for anxiety.  3. Will continue the patient on the medication Trazodone at 200 mgs po qhs for sleep.  4. Will continue the patient on a Librium Protocol for detox from alcohol dependence x 3 more days. 5. Will continue the patient on a Clonidine Protocol for detox from her opioid dependence.  6. Will continue the patient on her non-psychiatric medications as listed above.  7. Will increase the Fanapt to 4mg  BID. 8. Laboratory Studies reviewed.  9. Will continue to monitor.   Rona Ravens. Debe Anfinson [facility] 05/16/2012 11:26 AM    Lloyd Huger T. Omir [facility] 05/16/2012

## 2012-05-16 NOTE — Progress Notes (Addendum)
Psychoeducational Group Note  Date: 05/16/2012 Time: 1015  Group Topic/Focus:  Identifying Needs:   The focus of this group is to help patients identify their personal needs that have been historically problematic and identify healthy behaviors to address their needs.  Participation Level:  passive Participation Quality: minimal Affect: flat Cognitive: good   Insight:  good  Engagement in Group: engaged  Additional Comments:  PDuke RN [employer]

## 2012-05-16 NOTE — Progress Notes (Signed)
[facility] Group Notes:  (Counselor/Nursing/MHT/Case Management/Adjunct)  05/16/2012  12:30 PM  Type of Therapy:  Discharge Planning/Group Therapy  Participation Level:  Minimal  Participation Quality:  Attentive and Sharing  Affect:  Anxious  Cognitive:  Alert and Oriented  Insight:  Poor  Engagement in Group:  Limited  Engagement in Therapy:  Limited  Modes of Intervention:  Clarification, Education, Limit-setting, Problem-solving and Support  Summary of Progress/Problems: Pt. attended and participated in aftercare planning and group therapy.  Pt. accepted information on suicide prevention, warning signs to look for with suicide and crisis line numbers to use. The pt. agreed to call crisis line numbers if having warning signs or having thoughts of suicide. Therapist prompted Pts to honestly and openly disclose work on pertinent issues.  Pt. listed her current anxiety level as 10. Pt explained she was detoxing off of Valium she had taken as prescribed for 12 years. She stated that even though she was Schizophrenic, she was planning to go to [facility] to help her recover from the Valium Addiction and learn how to cope without using drugs.Roxan Hockey, Darcus Edds C 05/16/2012, 12:30  PM

## 2012-05-16 NOTE — Progress Notes (Signed)
D   Pt has been anxious and depressed today   She has attended and participated minimally in groups today   She has been less med seeking this afternoon than this morning   The clonidine and hctz have been held today due to her low blood presssure and fluids have been given freely to aid in healing of her uti A   Verbal support given   Medications administered and effectiveness monitored   Q 15 min checks   Encourage relaxation exercises R   Pt safe at present

## 2012-05-16 NOTE — Progress Notes (Signed)
[facility] Group Notes:  (Counselor/Nursing/MHT/Case Management/Adjunct)  05/16/2012 10:43 PM  Type of Therapy:  Psychoeducational Skills  Participation Level:  Minimal  Participation Quality:  Attentive  Affect:  Depressed  Cognitive:  Appropriate  Insight:  Good  Engagement in Group:  Limited  Engagement in Therapy:  Limited  Modes of Intervention:  Education  Summary of Progress/Problems: The patient verbalized that she didn't have a very good day on account of sleeping for much of the day.  Her goal for tomorrow is to attend more groups.    Patricia [name] 05/16/2012, 10:43 PM

## 2012-05-16 NOTE — Progress Notes (Signed)
Psychoeducational Group Note  Date:  05/16/2012 Time:  1515  Group Topic/Focus:  Healthy Communication:   The focus of this group is to discuss communication, barriers to communication, as well as healthy ways to communicate with others.  Participation Level:  Did Not Attend  Participation Quality:    Affect:    Cognitive:    Insight:    Engagement in Group:    Additional Comments:  none  Marquis Lunch, Vianca Bracher 05/16/2012, 6:49 PM

## 2012-05-17 NOTE — Progress Notes (Addendum)
Pt presents 1:1 with anxious affect consistent with mood. She reports she is still withdrawing from valium. BP improved this evening, COWS 2 based on anxiety. Attended evening wrap up group. Medicated per orders, support and reassurance given. Endorses AH - "mumbles that I can't really make out". Denies SI/HI/VH. Pt safe. Lawrence Marseilles

## 2012-05-17 NOTE — Progress Notes (Signed)
D   Pt has been complaining of anxiety and comes to the nurses station almost in tears  But has been observed in the dayroom with other pts laughing talking and seemingly having a good time   She has been somewhat intrusive and medication seeking   She did not attend spirituality group this morning   A   Verbal support given  Medications administered and effectiveness monitored   q 15 min checks   Encourage self calming exercises R   Pt safe at present

## 2012-05-17 NOTE — Progress Notes (Signed)
[facility] Group Notes:  (Counselor/Nursing/MHT/Case Management/Adjunct)  05/17/2012 11:55 PM  Type of Therapy:  Psychoeducational Skills  Participation Level:  Active  Participation Quality:  Appropriate  Affect:  Appropriate  Cognitive:  Appropriate  Insight:  Good  Engagement in Group:  Good  Engagement in Therapy:  Good  Modes of Intervention:  Education  Summary of Progress/Problems: We discussed in group this evening what each patient learned today and she verbalized that she needs to stand up for herself. The patient had a visitor this shift, but she would discuss the nature of the visit.  Her goal for tomorrow is to "go to every group".    Hazle Coca S 05/17/2012, 11:55 PM

## 2012-05-17 NOTE — Progress Notes (Signed)
Psychoeducational Group Note  Date:  05/17/2012 Time:  0945 am  Group Topic/Focus:  Making Healthy Choices:   The focus of this group is to help patients identify negative/unhealthy choices they were using prior to admission and identify positive/healthier coping strategies to replace them upon discharge.  Participation Level:  Did Not Attend   Rasheema Truluck J 05/17/2012, 10:29 AM  

## 2012-05-17 NOTE — Progress Notes (Signed)
Patient ID: Patricia [name], female   DOB: Jul 28, 1976, 35 y.o.   MRN: [id number]  [facility] Group Notes:  (Counselor/Nursing/MHT/Case Management/Adjunct)   [facility] Group Notes:  (Counselor/Nursing/MHT/Case Management/Adjunct)  05/17/2012 11 AM  Type of Therapy:  Aftercare Planning, Group Therapy, Dance/Movement Therapy   Participation Level:  Minimal  Participation Quality:  Inattentive  Affect:  Flat  Cognitive:  Alert  Insight:  Limited  Engagement in Group:  Limited  Engagement in Therapy:  Limited  Modes of Intervention:  Clarification, Problem-solving, Role-play, Socialization and Support  Summary of Progress/Problems: After Care: Pt. attended and participated in aftercare planning group. Pt. accepted information on suicide prevention, warning signs to look for with suicide and crisis line numbers to use. The pt. agreed to call crisis line numbers if having warning signs or having thoughts of suicide. Pt. listed their current anxiety level as high due to her thoughts.  Pt. Often lowered her head and held her head. Counseling:  Pt stated that she "has supports" but that she "does not use them". Pt could not share why she does not use her supports but did say that she was "fine" when asked about holding her head. Pt spoke about the "voices" being "loud".  [facility] 05/17/2012. 11:48 AM

## 2012-05-17 NOTE — Progress Notes (Signed)
  Patricia [name] is a 36 y.o. female [id number] 09/20/1975  05/11/2012 Principal Problem:  *Schizoaffective disorder, depressive type Active Problems:  Polysubstance dependence including opioid type drug, continuous use  Benzodiazepine dependence, continuous  UTI (lower urinary tract infection)   Mental Status: Alert mood -reports she is anxious denies SI/HI/AVH.   Subjective/Objective: Clothes brought to her had some pills in a pocket. Does not appear unusually anxious. Is being withdrawn from benzoes.    Filed Vitals:   05/17/12 0751  BP: 96/63  Pulse: 92  Temp:   Resp:     Lab Results:   BMET    Component Value Date/Time   NA 139 05/10/2012 2225   K 3.5 05/11/2012 1450   CL 98 05/10/2012 2225   CO2 25 04/23/2012 0030   GLUCOSE 132* 05/10/2012 2225   BUN 14 05/10/2012 2225   CREATININE 1.10 05/10/2012 2225   CALCIUM 9.2 04/23/2012 0030   GFRNONAA 64* 04/23/2012 0030   GFRAA 74* 04/23/2012 0030    Medications:  Scheduled:     . chlordiazePOXIDE  25 mg Oral BID   Followed by  . chlordiazePOXIDE  25 mg Oral Once  . ciprofloxacin  500 mg Oral BID  . cloNIDine  0.1 mg Oral QAC breakfast  . escitalopram  20 mg Oral Daily  . gabapentin  600 mg Oral TID AC & HS  . hydrochlorothiazide  25 mg Oral Daily  . iloperidone  4 mg Oral BID  . multivitamin with minerals  1 tablet Oral Daily  . pantoprazole  40 mg Oral QHS  . potassium chloride  20 mEq Oral Daily  . thiamine  100 mg Oral Daily  . traZODone  200 mg Oral QHS     PRN Meds acetaminophen, alum & mag hydroxide-simeth, dicyclomine, loperamide, magnesium hydroxide, methocarbamol, traZODone Plan will order some Vistaril to help with anxiety. Will continue current plan of care.   Etoile Looman,MICKIE D. 05/17/2012

## 2012-05-17 NOTE — Progress Notes (Signed)
Pt at med window asking for tylenol for low back pain of a 3/10. She remains somewhat anxious in affect with congruent mood however report of severe anxiety not consistent with presentation. She endorses AH today stating, "they are constantly arguing back and forth like children." Denies that they are command in nature and states she did not report this to dayshift because, "what can you all do about it." Reassured pt this writer would document for treatment team. Tylenol given with relief. Denies SI/HI/VH. Pt safe. Lawrence Marseilles

## 2012-05-18 MED ORDER — CHLORDIAZEPOXIDE HCL 25 MG PO CAPS
25.0000 mg | ORAL_CAPSULE | Freq: Once | ORAL | Status: AC
  Administered 2012-05-18: 25 mg via ORAL

## 2012-05-18 MED ORDER — DICLOFENAC SODIUM 1 % TD GEL
4.0000 g | TRANSDERMAL | Status: DC
  Administered 2012-05-18 – 2012-05-20 (×5): 4 g via TOPICAL
  Filled 2012-05-18: qty 100

## 2012-05-18 MED ORDER — ILOPERIDONE 4 MG PO TABS
4.0000 mg | ORAL_TABLET | ORAL | Status: DC
  Administered 2012-05-18 – 2012-05-20 (×4): 4 mg via ORAL
  Filled 2012-05-18 (×6): qty 1

## 2012-05-18 MED ORDER — TRAZODONE HCL 50 MG PO TABS: 150.0000 mg | ORAL_TABLET | Freq: Every evening | ORAL | Status: DC | PRN

## 2012-05-18 MED ORDER — CHLORDIAZEPOXIDE HCL 25 MG PO CAPS
ORAL_CAPSULE | ORAL | Status: AC
  Filled 2012-05-18: qty 1

## 2012-05-18 MED ORDER — HYDROXYZINE HCL 50 MG PO TABS
50.0000 mg | ORAL_TABLET | Freq: Four times a day (QID) | ORAL | Status: DC | PRN
  Administered 2012-05-18 – 2012-05-19 (×5): 50 mg via ORAL
  Filled 2012-05-18: qty 1

## 2012-05-18 NOTE — Progress Notes (Signed)
Patient ID: Patricia [name], female   DOB: 10-26-1975, 36 y.o.   MRN: [id number] DPatient reports fair sleep and good appetite.  She reports her energy level is low and her depression is a 5 as is her hopelessness.  She denies thoughts of suicide.  She was very anxious this am and concerned that she did not have her last dose of librium taper ordered.  A- Obtained order for librium and R-  patient was relieved and grateful.

## 2012-05-18 NOTE — Progress Notes (Signed)
D   Pt has isolated to her room most of the evening  She reports feeling very sleepy after taking vistaril  But is feeling calmer   Pt is calm and cooperative and treatment compliant A   Verbal support given   Medications administered and effectiveness monitored  Q 15 min checks R   Pt safe at present

## 2012-05-18 NOTE — Progress Notes (Signed)
Psychoeducational Group Note  Date:  05/18/2012 Time:  2000  Group Topic/Focus:  Wrap-Up Group:   The focus of this group is to help patients review their daily goal of treatment and discuss progress on daily workbooks.  Participation Level:  Active  Participation Quality:  Appropriate  Affect:  Appropriate  Cognitive:  Appropriate  Insight:  Good  Engagement in Group:  Good  Additional Comments:  Patient attended and participated in group tonight. She reports that she had a good day. The felt the day was long and boring. She watch television, attended groups and had a visitor. She report that she would like to start exercising to help take care of her wellness.  [facility] [place] 05/18/2012, 10:24 PM

## 2012-05-18 NOTE — Progress Notes (Signed)
[facility] MD Progress Note  05/18/2012 12:59 PM  Current Mental Status Per Physician:   Diagnosis:  Axis I:  Schizoaffective Disorder - Depressed Type.   Polysubstance Dependence - Including Opioids, Amphetamines, Benzodiazepines.  Axis II: Deferred.  Axis III: 1. Urinary Tract Infection.  Axis IV: Chronic Mental Illness. Chronic Substance Abuse Issues.  Axis V: GAF at time of admission approximately 30. Highest GAF in the past year approximately 50.   The patient was seen today and reports the following:   ADL's: Intact.  Sleep: The patient reports to having ongoing difficulty initiating and maintaining sleep.  Appetite: The patient reports a good appetite today.   Mild>(1-10) >Severe  Hopelessness (1-10): 0  Depression (1-10): 2  Anxiety (1-10): 2-3   Suicidal Ideation: The patient denies any suicidal ideations today.  Plan: No  Intent: No  Means: No   Homicidal Ideation: The patient denies any homicidal ideations today.  Plan: No  Intent: No.  Means: No   General Appearance/Behavior: The patient was cooperative today with this provider and appeared to be less anxious.  Eye Contact: Good.  Speech: Appropriate in rate and volume with no pressuring of speech noted today.  Motor Behavior: wnl.  Level of Consciousness: Alert and Oriented x 3.  Mental Status: Alert and Oriented x 3.  Mood: Mildly Depressed.  Affect: Mildly Constricted.  Anxiety Level: Mild to moderate anxiety reported today.  Thought Process: wnl.  Thought Content: The patient denies any current auditory or visual hallucinations or delusional thinking. Perception: wnl.  Judgment: Fair.  Insight: Fair.  Cognition: Oriented to person, place and time.  Sleep:  Number of Hours: 5.75    Vital Signs:Blood pressure 97/66, pulse 102, temperature 98.1 F (36.7 C), temperature source Oral, resp. rate 16, height 5' 8.5" (1.74 m), weight 101.606 kg (224 lb), last menstrual period 04/27/2012.  Current  Medications: Current Facility-Administered Medications  Medication Dose Route Frequency Provider Last Rate Last Dose  . acetaminophen (TYLENOL) tablet 650 mg  650 mg Oral Q6H PRN Kerry Hough, PA   650 mg at 05/18/12 1478  . alum & mag hydroxide-simeth (MAALOX/MYLANTA) 200-200-20 MG/5ML suspension 30 mL  30 mL Oral Q4H PRN Curlene Labrum Haylyn Halberg, MD   30 mL at 05/15/12 1937  . chlordiazePOXIDE (LIBRIUM) 25 MG capsule           . chlordiazePOXIDE (LIBRIUM) capsule 25 mg  25 mg Oral Once [employer], PA-C   25 mg at 05/17/12 1713  . chlordiazePOXIDE (LIBRIUM) capsule 25 mg  25 mg Oral Once Ronny Bacon, MD   25 mg at 05/18/12 0853  . ciprofloxacin (CIPRO) tablet 500 mg  500 mg Oral BID Curlene Labrum Toni Demo, MD   500 mg at 05/18/12 0811  . cloNIDine (CATAPRES) tablet 0.1 mg  0.1 mg Oral QAC breakfast Curlene Labrum Nou Chard, MD   0.1 mg at 05/17/12 0636  . diclofenac sodium (VOLTAREN) 1 % transdermal gel 4 g  4 g Topical BH-qamhs Ananias Kolander D Afreen Siebels, MD      . escitalopram (LEXAPRO) tablet 20 mg  20 mg Oral Daily Kerry Hough, PA   20 mg at 05/18/12 0811  . gabapentin (NEURONTIN) capsule 600 mg  600 mg Oral TID AC & HS Curlene Labrum Falisa Lamora, MD   600 mg at 05/18/12 1138  . hydrochlorothiazide (HYDRODIURIL) tablet 25 mg  25 mg Oral Daily Kerry Hough, PA   25 mg at 05/18/12 0811  . hydrOXYzine (ATARAX/VISTARIL) tablet 50 mg  50  mg Oral Q6H PRN Curlene Labrum Darinda [place], MD      . iloperidone (FANAPT) tablet 4 mg  4 mg Oral BID Verne Spurr, PA-C   4 mg at 05/18/12 0811  . magnesium hydroxide (MILK OF MAGNESIA) suspension 30 mL  30 mL Oral Daily PRN Kerry Hough, PA      . multivitamin with minerals tablet 1 tablet  1 tablet Oral Daily Ronny Bacon, MD   1 tablet at 05/18/12 0811  . pantoprazole (PROTONIX) EC tablet 40 mg  40 mg Oral QHS Curlene Labrum Bristyn Kulesza, MD   40 mg at 05/17/12 2112  . potassium chloride SA (K-DUR,KLOR-CON) CR tablet 20 mEq  20 mEq Oral Daily Kerry Hough, PA   20 mEq at 05/18/12 0811  .  thiamine (VITAMIN B-1) tablet 100 mg  100 mg Oral Daily Curlene Labrum Kieon Lawhorn, MD   100 mg at 05/18/12 0811  . traZODone (DESYREL) tablet 150 mg  150 mg Oral QHS PRN Ronny Bacon, MD      . traZODone (DESYREL) tablet 200 mg  200 mg Oral QHS Kerry Hough, PA   200 mg at 05/17/12 2112  . DISCONTD: traZODone (DESYREL) tablet 50 mg  50 mg Oral QHS PRN Kerry Hough, PA   50 mg at 05/16/12 2128   Lab Results: No results found for this or any previous visit (from the past 48 hour(s)).  Physical Findings: AIMS: Facial and Oral Movements Muscles of Facial Expression: None, normal Lips and Perioral Area: None, normal Jaw: None, normal Tongue: None, normal,Extremity Movements Upper (arms, wrists, hands, fingers): None, normal Lower (legs, knees, ankles, toes): None, normal, Trunk Movements Neck, shoulders, hips: None, normal, Overall Severity Severity of abnormal movements (highest score from questions above): None, normal Incapacitation due to abnormal movements: None, normal Patient's awareness of abnormal movements (rate only patient's report): No Awareness, Dental Status Current problems with teeth and/or dentures?: No Does patient usually wear dentures?: No  CIWA:  CIWA-Ar Total: 1  COWS:  COWS Total Score: 1   Review of Systems:  Neurological: No headaches, seizures or dizziness reported.  G.I.: The patient reports significant G.I. Upset today.  Musculoskeletal: The patient denies any musculoskeletal issues today.  Extremities:  Chronic bilateral foot pain.  Time was spent today discussing with the patient her current symptoms. The patient states that she is continuing to have ongoing difficulty initiating and maintaining sleep.  She reports a good appetite as well as improvement in the severity of her depressive symptoms.  She reports mild feelings of sadness, anhedonia and depressed mood and denies any suicidal or homicidal ideations. She denies any auditory or visual hallucinations  or delusional thinking and states the "noises" have resolved. She continues to report mild to moderate anxiety symptoms and denies any medication related side effects.   The patient does report severe "foot pain" and asked for a medication for the pain.  Treatment Plan Summary:  1. Daily contact with patient to assess and evaluate symptoms and progress in treatment.  2. Medication management  3. The patient will deny suicidal ideations or homicidal ideations for 48 hours prior to discharge and have a depression and anxiety rating of 3 or less. The patient will also deny any auditory or visual hallucinations or delusional thinking.  4. The patient will deny any symptoms of substance withdrawal at time of discharge.   Plan:  1. Will continue the patient on the medication Lexapro 20 mgs po q am for depression  and anxiety.  2. Will continue the medication Neurontin at 600 mgs po TID - AC and hs for anxiety.  3. Will continue the patient on the medication Trazodone at 150 mgs po qhs for sleep.  4. Will continue the patient on the medication Fanapt 4 mgs po BID for psychosis. 5. Will start Vistaril 50 mgs po q 6 hours - prn for anxiety. 6. Will start Voltaren 1% gel to apply topically q am and hs for chronic foot pain. 7. Will continue the patient on her non-psychiatric medications as listed above.  8. Laboratory Studies reviewed.  9. Will continue to monitor.   Carine Nordgren 05/18/2012, 12:59 PM

## 2012-05-18 NOTE — Progress Notes (Signed)
Psychoeducational Group Note  Date:  05/18/2012 Time:  1100  Group Topic/Focus:  Self Care:   The focus of this group is to help patients understand the importance of self-care in order to improve or restore emotional, physical, spiritual, interpersonal, and financial health.  Participation Level:  Active  Participation Quality:  Appropriate and Attentive  Affect:  Appropriate  Cognitive:  Appropriate  Insight:  Good  Engagement in Group:  Good  Additional Comments:  Pt participated and processed in group.  Marquis Lunch, Dequarius Jeffries 05/18/2012, 1:00 PM

## 2012-05-19 ENCOUNTER — Ambulatory Visit (HOSPITAL_COMMUNITY): Admission: RE | Admit: 2012-05-19 | Payer: Self-pay | Source: Ambulatory Visit

## 2012-05-19 LAB — URINALYSIS, ROUTINE W REFLEX MICROSCOPIC
Glucose, UA: NEGATIVE mg/dL
Hgb urine dipstick: NEGATIVE
Ketones, ur: NEGATIVE mg/dL
Protein, ur: NEGATIVE mg/dL
Urobilinogen, UA: 0.2 mg/dL (ref 0.0–1.0)

## 2012-05-19 MED ORDER — NICOTINE POLACRILEX 2 MG MT GUM
2.0000 mg | CHEWING_GUM | OROMUCOSAL | Status: DC | PRN
  Administered 2012-05-19 (×3): 2 mg via ORAL

## 2012-05-19 NOTE — Progress Notes (Signed)
[facility] Group Notes:  (Counselor/Nursing/MHT/Case Management/Adjunct)  05/19/2012 2:16 PM  Type of Therapy:  Group Therapy  Participation Level:  Active  Participation Quality:  Appropriate  Affect:  Depressed  Cognitive:  Oriented  Insight:  Good  Engagement in Group:  Good  Engagement in Therapy:  Good  Modes of Intervention:  Education, Problem-solving, Support and Exploration  Summary of Progress/Problems: Patient talked about just having to wait for a bed at [facility]. Encouraged her to work on issues while waiting. Identified problems with her mood being up and down. Identified one of the main problems as being by herself a lot. She has enrolled at a community college and is taking 16 hours. She hopes to get back to school until she has to go to treatment. She is studying welding. She was not surprised by the events of treatment team where her boyfriend asked that she stay awhile longer. She stated that they had talked about it.   Tashawn Greff, Aram Beecham 05/19/2012, 2:16 PM

## 2012-05-19 NOTE — Discharge Planning (Signed)
05/19/2012  SW met with Patricia [name] in discharge planning group.  SW found Patricia [name] to need contacts made on their behalf to schedule appts with [facility] in [place].  SW found referral has been made to [facility] and pt is currently on the wait list pt can admit to [facility] on Thursday 05-21-12. SW will continue to assess for referrals.  Clarice Pole, LCASA 05/19/2012, 4:59 PM

## 2012-05-19 NOTE — Progress Notes (Signed)
D: Pt resting. Eyes closed. Respirations even and unlabored. A: Continue close observation for pt safety. R: Pt remains safe on the unit.

## 2012-05-19 NOTE — Progress Notes (Signed)
Patient ID: Patricia [name], female   DOB: Nov 28, 1975, 36 y.o.   MRN: [id number] Patient reports she slept well and her appetite is good.  Her energy level is normal and she denies depression or hopelessness.  She feels she is ready for discharge. She continues to have anxiety and has asked for prn vistaril 2 times today.  Her friend [name] came to treatment team and said he did not feel she was ready to go based on his experience with her baseline. The team talked about her discharge plan, and discharge is planned for Thursday. Patient was agreeable with plan.  She continues to have swelling in her L foot  and pain in both feet.

## 2012-05-19 NOTE — Progress Notes (Signed)
Patient resting quietly with eyes closed. Respirations even and unlabored, no distress noted. Q15 minute check continues to maintain safety. 

## 2012-05-19 NOTE — Progress Notes (Signed)
Psychoeducational Group Note  Date:  05/19/2012 Time:  0930  Group Topic/Focus:  Recovery Goals:   The focus of this group is to identify appropriate goals for recovery and establish a plan to achieve them.  Participation Level:  Active  Participation Quality:  Appropriate and Sharing  Affect:  Appropriate  Cognitive:  Appropriate  Insight:  Good  Engagement in Group:  Good  Additional Comments:  Pt participated well in group this morning and also share positively.  Peter Keyworth E 05/19/2012, 11:29 AM

## 2012-05-19 NOTE — Progress Notes (Signed)
[facility] MD Progress Note  05/19/2012 12:12 PM  Current Mental Status Per Physician:  Diagnosis:  Axis I: Schizoaffective Disorder - Depressed Type.  Polysubstance Dependence - Including Opioids, Amphetamines, Benzodiazepines.  Axis II: Deferred.  Axis III: 1. Urinary Tract Infection.  Axis IV: Chronic Mental Illness. Chronic Substance Abuse Issues.  Axis V: GAF at time of admission approximately 30. Highest GAF in the past year approximately 50.   The patient was seen today and reports the following:   ADL's: Intact.  Sleep: The patient reports to sleeping well last night and according to staff she slept 6.75 hours last night. Appetite: The patient reports a good appetite today.   Mild>(1-10) >Severe  Hopelessness (1-10): 0  Depression (1-10): 0  Anxiety (1-10): 0   Suicidal Ideation: The patient denies any suicidal ideations today.  Plan: No  Intent: No  Means: No   Homicidal Ideation: The patient denies any homicidal ideations today.  Plan: No  Intent: No.  Means: No   General Appearance/Behavior: The patient was cooperative today with this provider and appeared to be less anxious.  Eye Contact: Good.  Speech: Appropriate in rate and volume with no pressuring of speech noted today.  Motor Behavior: wnl.  Level of Consciousness: Alert and Oriented x 3.  Mental Status: Alert and Oriented x 3.  Mood: Essentially Euthymic.  Affect: Mildly Constricted.  Anxiety Level: No anxiety reported today.  Thought Process: wnl.  Thought Content: The patient denies any current auditory or visual hallucinations or delusional thinking.  Perception: wnl.  Judgment: Fair to good.  Insight: Fair to good.  Cognition: Oriented to person, place and time.  Sleep:  Number of Hours: 6.75    Vital Signs:Blood pressure 103/70, pulse 91, temperature 98.6 F (37 C), temperature source Oral, resp. rate 16, height 5' 8.5" (1.74 m), weight 101.606 kg (224 lb), last menstrual period  04/27/2012.  Current Medications: Current Facility-Administered Medications  Medication Dose Route Frequency Provider Last Rate Last Dose  . acetaminophen (TYLENOL) tablet 650 mg  650 mg Oral Q6H PRN Kerry Hough, PA   650 mg at 05/19/12 0981  . alum & mag hydroxide-simeth (MAALOX/MYLANTA) 200-200-20 MG/5ML suspension 30 mL  30 mL Oral Q4H PRN Curlene Labrum Readling, MD   30 mL at 05/15/12 1937  . chlordiazePOXIDE (LIBRIUM) 25 MG capsule           . ciprofloxacin (CIPRO) tablet 500 mg  500 mg Oral BID Curlene Labrum Readling, MD   500 mg at 05/18/12 2138  . diclofenac sodium (VOLTAREN) 1 % transdermal gel 4 g  4 g Topical BH-qamhs Curlene Labrum Readling, MD   4 g at 05/19/12 0749  . escitalopram (LEXAPRO) tablet 20 mg  20 mg Oral Daily Kerry Hough, PA   20 mg at 05/19/12 0749  . gabapentin (NEURONTIN) capsule 600 mg  600 mg Oral TID AC & HS Curlene Labrum Readling, MD   600 mg at 05/19/12 1144  . hydrochlorothiazide (HYDRODIURIL) tablet 25 mg  25 mg Oral Daily Kerry Hough, PA   25 mg at 05/19/12 0749  . hydrOXYzine (ATARAX/VISTARIL) tablet 50 mg  50 mg Oral Q6H PRN Ronny Bacon, MD   50 mg at 05/19/12 0751  . iloperidone (FANAPT) tablet 4 mg  4 mg Oral BH-qamhs Curlene Labrum Readling, MD   4 mg at 05/19/12 0748  . magnesium hydroxide (MILK OF MAGNESIA) suspension 30 mL  30 mL Oral Daily PRN Kerry Hough, PA      .  multivitamin with minerals tablet 1 tablet  1 tablet Oral Daily Ronny Bacon, MD   1 tablet at 05/19/12 0748  . pantoprazole (PROTONIX) EC tablet 40 mg  40 mg Oral QHS Curlene Labrum Readling, MD   40 mg at 05/18/12 2138  . potassium chloride SA (K-DUR,KLOR-CON) CR tablet 20 mEq  20 mEq Oral Daily Kerry Hough, PA   20 mEq at 05/19/12 0749  . thiamine (VITAMIN B-1) tablet 100 mg  100 mg Oral Daily Curlene Labrum Readling, MD   100 mg at 05/19/12 0748  . traZODone (DESYREL) tablet 200 mg  200 mg Oral QHS Kerry Hough, PA   200 mg at 05/18/12 2137  . DISCONTD: iloperidone (FANAPT) tablet 4 mg  4 mg Oral  BID Verne Spurr, PA-C   4 mg at 05/18/12 1610  . DISCONTD: traZODone (DESYREL) tablet 150 mg  150 mg Oral QHS PRN Ronny Bacon, MD      . DISCONTD: traZODone (DESYREL) tablet 50 mg  50 mg Oral QHS PRN Kerry Hough, PA   50 mg at 05/16/12 2128   Lab Results: No results found for this or any previous visit (from the past 48 hour(s)).  Physical Findings: AIMS: Facial and Oral Movements Muscles of Facial Expression: None, normal Lips and Perioral Area: None, normal Jaw: None, normal Tongue: None, normal,Extremity Movements Upper (arms, wrists, hands, fingers): None, normal Lower (legs, knees, ankles, toes): None, normal, Trunk Movements Neck, shoulders, hips: None, normal, Overall Severity Severity of abnormal movements (highest score from questions above): None, normal Incapacitation due to abnormal movements: None, normal Patient's awareness of abnormal movements (rate only patient's report): No Awareness, Dental Status Current problems with teeth and/or dentures?: No Does patient usually wear dentures?: No  CIWA:  CIWA-Ar Total: 1  COWS:  COWS Total Score: 1   Review of Systems:  Neurological: No headaches, seizures or dizziness reported.  G.I.: The patient reports significant G.I. Upset today.  Musculoskeletal: The patient denies any musculoskeletal issues today.  Extremities: Chronic bilateral foot pain which is much improved today.   Time was spent today discussing with the patient her current symptoms. The patient states that she is now sleeping well without difficulty and reports a good appetite.  She denies any significant feelings of sadness, anhedonia or depressed mood and denies any suicidal or homicidal ideations. She denies any auditory or visual hallucinations or delusional thinking and states the "noises" have continued to be resolved. She denies any anxiety symptoms today and denies any medication related side effects.   The patient's boyfriend asked to meet with  the treatment team this morning and stated that he did not feel the patient was ready for discharge.  He states he can tell from her behavior that she will relapse if discharged today.  He is requesting to wait until Thursday for discharge and this will be honored.  The patient is aware that her stay may not be covered.  Treatment Plan Summary:  1. Daily contact with patient to assess and evaluate symptoms and progress in treatment.  2. Medication management  3. The patient will deny suicidal ideations or homicidal ideations for 48 hours prior to discharge and have a depression and anxiety rating of 3 or less. The patient will also deny any auditory or visual hallucinations or delusional thinking.  4. The patient will deny any symptoms of substance withdrawal at time of discharge.   Plan:  1. Will continue the patient on the medication Lexapro  20 mgs po q am for depression and anxiety.  2. Will continue the medication Neurontin at 600 mgs po TID - AC and hs for anxiety.  3. Will continue the patient on the medication Trazodone at 150 mgs po qhs for sleep.  4. Will continue the patient on the medication Fanapt 4 mgs po BID for psychosis.  5. Will start Vistaril 50 mgs po q 6 hours - prn for anxiety.  6. Will start Voltaren 1% gel to apply topically q am and hs for chronic foot pain.  7. Will continue the patient on her non-psychiatric medications as listed above.  8. Laboratory Studies reviewed.  9. Will order a repeat CBC with Diff, CMP, TSH, Free T3, Free T4 and UA for this evening. 10. Will continue to monitor.   RANDY READLING 05/19/2012, 12:12 PM

## 2012-05-19 NOTE — Progress Notes (Signed)
D:  Behavior appropriate.  Participated in groups. A:  Medications given per MD order.  Support and encouragement given throughout day.   R:  Following treatment plan.   Denied SI & HI.   Denied A/V hallucinations.  Contracts for safety.

## 2012-05-20 MED ORDER — HYDROXYZINE HCL 50 MG PO TABS: 50.0000 mg | ORAL_TABLET | Freq: Three times a day (TID) | ORAL | Status: AC | PRN

## 2012-05-20 MED ORDER — GABAPENTIN 600 MG PO TABS
600.0000 mg | ORAL_TABLET | Freq: Four times a day (QID) | ORAL | Status: DC
  Filled 2012-05-20: qty 42

## 2012-05-20 MED ORDER — HYDROXYZINE HCL 25 MG PO TABS
25.0000 mg | ORAL_TABLET | Freq: Three times a day (TID) | ORAL | Status: DC | PRN
  Filled 2012-05-20: qty 20

## 2012-05-20 MED ORDER — ILOPERIDONE 4 MG PO TABS: 4.0000 mg | ORAL_TABLET | ORAL | Status: AC

## 2012-05-20 MED ORDER — ESCITALOPRAM OXALATE 20 MG PO TABS: 20.0000 mg | ORAL_TABLET | Freq: Every day | ORAL | Status: AC

## 2012-05-20 MED ORDER — PANTOPRAZOLE SODIUM 40 MG PO TBEC: 40.0000 mg | DELAYED_RELEASE_TABLET | Freq: Every day | ORAL | Status: AC

## 2012-05-20 MED ORDER — ADULT MULTIVITAMIN W/MINERALS CH: 1.0000 | ORAL_TABLET | Freq: Every day | ORAL | Status: AC

## 2012-05-20 MED ORDER — DICLOFENAC SODIUM 1 % TD GEL: 4.0000 g | TRANSDERMAL | Status: AC

## 2012-05-20 MED ORDER — HYDROCHLOROTHIAZIDE 25 MG PO TABS: 25.0000 mg | ORAL_TABLET | Freq: Every day | ORAL | Status: AC

## 2012-05-20 MED ORDER — POTASSIUM CHLORIDE CRYS ER 20 MEQ PO TBCR: 20.0000 meq | EXTENDED_RELEASE_TABLET | Freq: Every day | ORAL | Status: AC

## 2012-05-20 MED ORDER — GABAPENTIN 300 MG PO CAPS: 600.0000 mg | ORAL_CAPSULE | Freq: Three times a day (TID) | ORAL | Status: AC

## 2012-05-20 MED ORDER — THIAMINE HCL 100 MG PO TABS: 100.0000 mg | ORAL_TABLET | Freq: Every day | ORAL | Status: AC

## 2012-05-20 MED ORDER — TRAZODONE HCL 100 MG PO TABS: 200.0000 mg | ORAL_TABLET | Freq: Every day | ORAL | Status: AC

## 2012-05-20 NOTE — Progress Notes (Signed)
Pt was discharged home today.  Her friend picked her up.  She denies SI/HI and A/V hallucinations.  RN reviewed discharge follow up appointments with her.  Discharge medications reviewed with her also with voiced understanding given.  She was also given samples of her medication.  Patient obtained belongings out of her room and all belongings out of her locker prior to leaving.

## 2012-05-20 NOTE — [facility] Suicide Risk Assessment (Signed)
Suicide Risk Assessment  Discharge Assessment     Demographic Factors:  Caucasian, Low socioeconomic status and Unemployed  Mental Status Per Nursing Assessment::   On Admission:   On Discharge:  Time was spent today discussing with the patient her current symptoms. The patient states that she is continuing to sleep well without difficulty and reports a good appetite. She denies any significant feelings of sadness, anhedonia or depressed mood and adamantly denies any suicidal or homicidal ideations. She denies any auditory or visual hallucinations or delusional thinking and states the "noises" have continued to be resolved.  She denies any anxiety symptoms today and denies any medication related side effects.   The patient has been accepted into [facility] for tomorrow but the patient and her boyfriend have now decided that they do not wish the patient to enter [facility].  They are requesting that the patient be discharged today to outpatient follow up and this will be ordered.  Loss Factors: No acute losses reported.  Historical Factors: Long history of mental illness.  Long history of substance abuse.  Non-compliance with medications.  Risk Reduction Factors:   Good family support.  Good access to healthcare.    Continued Clinical Symptoms:  Alcohol/Substance Abuse/Dependencies More than one psychiatric diagnosis Previous Psychiatric Diagnoses and Treatments Schizoaffective Disorder - Depressed Type.  Current Mental Status Per Physician:  Diagnosis:  Axis I:  Schizoaffective Disorder - Depressed Type.   Polysubstance Dependence - Including Opioids, Amphetamines, Benzodiazepines.  Axis II: Deferred.  Axis III: 1. Urinary Tract Infection.  Axis IV: Chronic Mental Illness. Chronic Substance Abuse Issues.  Axis V: GAF at time of admission approximately 30. Highest GAF in the past year approximately 50.   The patient was seen today and reports the following:   ADL's: Intact.  Sleep: The  patient reports to sleeping well last night without difficulty.  Appetite: The patient reports a good appetite today.   Mild>(1-10) >Severe  Hopelessness (1-10): 0  Depression (1-10): 0  Anxiety (1-10): 0   Suicidal Ideation: The patient adamantly denies any suicidal ideations today.  Plan: No  Intent: No  Means: No   Homicidal Ideation: The patient adamantly denies any homicidal ideations today.  Plan: No  Intent: No.  Means: No   General Appearance/Behavior: The patient was friendly and cooperative today with this provider with no complaints.  Eye Contact: Good.  Speech: Appropriate in rate and volume with no pressuring of speech noted today.  Motor Behavior: wnl.  Level of Consciousness: Alert and Oriented x 3.  Mental Status: Alert and Oriented x 3.  Mood: Essentially Euthymic.  Affect: Essentially Full today.  Anxiety Level: No anxiety reported today.  Thought Process: wnl.  Thought Content: The patient denies any current auditory or visual hallucinations or delusional thinking.  Perception: wnl.  Judgment: Fair to good.  Insight: Fair to good.  Cognition: Oriented to person, place and time.   Discharge Diagnoses: Axis I: Schizoaffective Disorder - Depressed Type.  Polysubstance Dependence - Including Opioids, Amphetamines, Benzodiazepines.  Axis II: Deferred.  Axis III: 1. Urinary Tract Infection.  Axis IV: Chronic Mental Illness. Chronic Substance Abuse Issues.  Axis V: GAF at time of admission approximately 30. Highest GAF in the past year approximately 50.    Cognitive Features That Contribute To Risk:  Polarized thinking    Current Medications:  Current Facility-Administered Medications   Medication  Dose  Route  Frequency  Provider  Last Rate  Last Dose   .  acetaminophen (TYLENOL)  tablet 650 mg  650 mg  Oral  Q6H PRN  Kerry Hough, PA   650 mg at 05/19/12 0454   .  alum & mag hydroxide-simeth (MAALOX/MYLANTA) 200-200-20 MG/5ML suspension 30 mL  30 mL   Oral  Q4H PRN  Curlene Labrum Juliah Scadden, MD   30 mL at 05/15/12 1937   .  chlordiazePOXIDE (LIBRIUM) 25 MG capsule         .  ciprofloxacin (CIPRO) tablet 500 mg  500 mg  Oral  BID  Curlene Labrum Judit Awad, MD   500 mg at 05/18/12 2138   .  diclofenac sodium (VOLTAREN) 1 % transdermal gel 4 g  4 g  Topical  BH-qamhs  Curlene Labrum Boyd Buffalo, MD   4 g at 05/19/12 0749   .  escitalopram (LEXAPRO) tablet 20 mg  20 mg  Oral  Daily  Kerry Hough, PA   20 mg at 05/19/12 0749   .  gabapentin (NEURONTIN) capsule 600 mg  600 mg  Oral  TID AC & HS  Curlene Labrum Janika Jedlicka, MD   600 mg at 05/19/12 1144   .  hydrochlorothiazide (HYDRODIURIL) tablet 25 mg  25 mg  Oral  Daily  Kerry Hough, PA   25 mg at 05/19/12 0749   .  hydrOXYzine (ATARAX/VISTARIL) tablet 50 mg  50 mg  Oral  Q6H PRN  Ronny Bacon, MD   50 mg at 05/19/12 0751   .  iloperidone (FANAPT) tablet 4 mg  4 mg  Oral  BH-qamhs  Curlene Labrum Trayquan Kolakowski, MD   4 mg at 05/19/12 0748   .  magnesium hydroxide (MILK OF MAGNESIA) suspension 30 mL  30 mL  Oral  Daily PRN  Kerry Hough, PA     .  multivitamin with minerals tablet 1 tablet  1 tablet  Oral  Daily  Ronny Bacon, MD   1 tablet at 05/19/12 0748   .  pantoprazole (PROTONIX) EC tablet 40 mg  40 mg  Oral  QHS  Curlene Labrum Paxtyn Wisdom, MD   40 mg at 05/18/12 2138   .  potassium chloride SA (K-DUR,KLOR-CON) CR tablet 20 mEq  20 mEq  Oral  Daily  Kerry Hough, PA   20 mEq at 05/19/12 0749   .  thiamine (VITAMIN B-1) tablet 100 mg  100 mg  Oral  Daily  Curlene Labrum Walter Grima, MD   100 mg at 05/19/12 0748   .  traZODone (DESYREL) tablet 200 mg  200 mg  Oral  QHS  Kerry Hough, PA   200 mg at 05/18/12 2137   .  DISCONTD: iloperidone (FANAPT) tablet 4 mg  4 mg  Oral  BID  Verne Spurr, PA-C   4 mg at 05/18/12 0981   .  DISCONTD: traZODone (DESYREL) tablet 150 mg  150 mg  Oral  QHS PRN  Ronny Bacon, MD     .  DISCONTD: traZODone (DESYREL) tablet 50 mg  50 mg  Oral  QHS PRN  Kerry Hough, PA   50 mg at 05/16/12 2128    Lab  Results: No results found for this or any previous visit (from the past 48 hour(s)).   Physical Findings:  AIMS: Facial and Oral Movements  Muscles of Facial Expression: None, normal  Lips and Perioral Area: None, normal  Jaw: None, normal  Tongue: None, normal,Extremity Movements  Upper (arms, wrists, hands, fingers): None, normal  Lower (legs, knees, ankles, toes):  None, normal, Trunk Movements  Neck, shoulders, hips: None, normal, Overall Severity  Severity of abnormal movements (highest score from questions above): None, normal  Incapacitation due to abnormal movements: None, normal  Patient's awareness of abnormal movements (rate only patient's report): No Awareness, Dental Status  Current problems with teeth and/or dentures?: No  Does patient usually wear dentures?: No  CIWA: CIWA-Ar Total: 1  COWS: COWS Total Score: 1   Review of Systems:  Neurological: No headaches, seizures or dizziness reported.  G.I.: The patient reports significant G.I. Upset today.  Musculoskeletal: The patient denies any musculoskeletal issues today.  Extremities: Chronic bilateral foot pain which is much improved today.   Time was spent today discussing with the patient her current symptoms. The patient states that she is continuing to sleep well without difficulty and reports a good appetite. She denies any significant feelings of sadness, anhedonia or depressed mood and adamantly denies any suicidal or homicidal ideations. She denies any auditory or visual hallucinations or delusional thinking and states the "noises" have continued to be resolved.  She denies any anxiety symptoms today and denies any medication related side effects.   The patient has been accepted into [facility] for tomorrow but the patient and her boyfriend have now decided that they do not wish the patient to enter [facility].  They are requesting that the patient be discharged today to outpatient follow up and this will be ordered.  Treatment  Plan Summary:  1. Daily contact with patient to assess and evaluate symptoms and progress in treatment.  2. Medication management  3. The patient will deny suicidal ideations or homicidal ideations for 48 hours prior to discharge and have a depression and anxiety rating of 3 or less. The patient will also deny any auditory or visual hallucinations or delusional thinking.  4. The patient will deny any symptoms of substance withdrawal at time of discharge.   Plan:  1. Will continue the patient on the medication Lexapro 20 mgs po q am for depression and anxiety.  2. Will continue the medication Neurontin at 600 mgs po TID - AC and hs for anxiety.  3. Will continue the patient on the medication Trazodone at 150 mgs po qhs for sleep.  4. Will continue the patient on the medication Fanapt 4 mgs po BID for psychosis.  5. Will continue the patient on the medication Vistaril at 50 mgs po q 8 hours - prn for anxiety.  6. Will continue the patient on the medication Voltaren 1% gel to apply topically q am and hs for chronic foot pain.  7. Will continue the patient on her non-psychiatric medications as listed above.  8. Laboratory Studies reviewed.  9. Will continue to monitor.  10. The patient will be discharged today to outpatient follow up as requested.  Suicide Risk:  Minimal: No identifiable suicidal ideation.  Patients presenting with no risk factors but with morbid ruminations; may be classified as minimal risk based on the severity of the depressive symptoms  Plan Of Care/Follow-up recommendations:  Activity:  As tolerated. Diet:  Regular Diet. Other:  Please take all medications only as directed and keep all scheduled follow up appointments.  Please abstain from any use of alcohol or illicit drugs. Please return to your local Emergency Room should you have any thoughts of harming yourself or others.  Deloris Moger 05/20/2012, 12:09 PM
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
## 2012-05-20 NOTE — Progress Notes (Signed)
[facility] Group Notes:  (Counselor/Nursing/MHT/Case Management/Adjunct)  05/20/2012 1:16 PM  Type of Therapy:  Group Therapy  Participation Level:  Did Not Attend  Patricia [name] 05/20/2012, 1:16 PM

## 2012-05-20 NOTE — Discharge Planning (Signed)
05/20/2012  SW met with patient in discharge planning group.  Patient stated she wanted to discharge today if possible, and she did not want to go to [facility].  Pt will follow-up with [facility] walk-in clinic prior to her scheduled appointment.  SW submitted application for [employer] financial scholarship funds.  Resources provided regarding [facility].  Pt will inquire about begin [facility] if interested at a later day.  SW to continue to monitor and assess for referrals.

## 2012-05-20 NOTE — Progress Notes (Signed)
05/20/2012         Time: 0930      Group Topic/Focus: The focus of this group is on enhancing the patient's understanding of leisure, barriers to leisure, and the importance of engaging in positive leisure activities upon discharge for improved total health.  Participation Level: Active  Participation Quality: Appropriate and Attentive  Affect: Appropriate  Cognitive: Oriented   Additional Comments: Patient bright, interacting well with peers, able to identify positive leisure interests she can engage in upon discharge.   Tyler Cubit 05/20/2012 10:13 AM

## 2012-05-21 ENCOUNTER — Encounter: Payer: Self-pay | Admitting: Obstetrics & Gynecology

## 2012-05-21 NOTE — Progress Notes (Signed)
[facility] Case Management Discharge Plan:  Will you be returning to the same living situation after discharge: Yes,    At discharge, do you have transportation home?:Yes,   transportation with boyfirend Do you have the ability to pay for your medications:Yes,     Interagency Information:     Release of information consent forms completed and in the chart;  Patient's signature needed at discharge.  Patient to Follow up at:  Follow-up Information    Follow up with [facility] on 05/26/2012. (appt. @ 1:30pm for medication management.  Please follow-up with the Walk-in clinic on 05-21-12)    Contact information:   [address] [place] [phone] fax      Follow up with [facility] on 05/21/2012. (intake is scheduled for 05-21-12 at 10:30am.)    Contact information:   [address]  [place], [place] [address] [phone]         Patient denies SI/HI:   Yes,       Safety Planning and Suicide Prevention discussed:  Yes,     Barrier to discharge identified:No.  Summary and Recommendations:

## 2012-05-22 NOTE — Progress Notes (Signed)
Patient Discharge Instructions:  After Visit Summary (AVS):   Faxed to:  05/22/2012 Psychiatric Admission Assessment Note:   Faxed to:  05/22/2012 Suicide Risk Assessment - Discharge Assessment:   Faxed to:  05/22/2012 Faxed/Sent to the Next Level Care provider:  05/22/2012  Faxed to [facility]: [phone] & [facility] @ [phone]  Karleen Hampshire Brittini, 05/22/2012, 10:27 AM

## 2012-06-07 NOTE — Discharge Summary (Signed)
Physician Discharge Summary Note  Patient:  Patricia [name] is an 35 y.o., female MRN:  [id number] DOB:  11/26/1975 Patient phone:  [phone] (home)  Patient address:   [address] [place] [address]   Date of Admission:  05/11/2012 Date of Discharge: 05/20/2012  Discharge Diagnoses: Principal Problem:  *Schizoaffective disorder, depressive type Active Problems:  Polysubstance dependence including opioid type drug, continuous use  UTI (lower urinary tract infection)  Axis Diagnosis:  Axis I: Schizoaffective Disorder - Depressed Type.  Polysubstance Dependence - Including Opioids, Amphetamines, Benzodiazepines.  Axis II: Deferred.  Axis III: 1. Urinary Tract Infection.  Axis IV: Chronic Mental Illness. Chronic Substance Abuse Issues.  Axis V: GAF at time of admission approximately 30. Highest GAF in the past year approximately 50.   Level of Care:  Inpatient Hospitalization.  Reason For Admission:  Time was spent today discussing with the patient her current symptoms. The patient states that she is having difficulty initiating and maintaining sleep and reports a decreased appetite. She reports severe feelings of sadness, anhedonia and depressed mood but denies any current suicidal or homicidal ideations. She denies any auditory or visual hallucinations or delusional thinking and reports severe anxiety symptoms.  The patient states that she has been using cocaine and amphetamines. She also reports to taking 40 mgs of Valium per day. According to the patient's boyfriend, she has a long history of substance abuse and likely is not being truthful in her disclosure.    Hospital Course:   The patient attended treatment team meeting this am and met with treatment team members. The patient's symptoms, treatment plan and response to treatment was discussed. The patient endorsed that their symptoms have improved. The patient also stated that they felt stable for discharge.  They  reported that from this hospital stay they had learned many coping skills.  In other to maintain their psychiatric stability, they will continue psychiatric care on an outpatient basis. They will follow-up as outlined below.  In addition they were instructed  to take all your medications as prescribed by their mental healthcare provider and to report any adverse effects and or reactions from your medicines to their outpatient provider promptly.  The patient is also instructed and cautioned to not engage in alcohol and or illegal drug use while on prescription medicines.  In the event of worsening symptoms the patient is instructed to call the crisis hotline, 911 and or go to the nearest ED for appropriate evaluation and treatment of symptoms.   Also while a patient in this hospital, the patient received medication management for his psychiatric symptoms. They were ordered and received as outlined below:    Medication List     As of 06/07/2012  9:05 PM    STOP taking these medications         acetaminophen 500 MG tablet   Commonly known as: TYLENOL      diazepam 10 MG tablet   Commonly known as: VALIUM      diclofenac 75 MG EC tablet   Commonly known as: VOLTAREN      TAKE these medications      Indication    diclofenac sodium 1 % Gel   Commonly known as: VOLTAREN   Apply 4 g topically 2 (two) times daily in the am and at bedtime.. To bottoms of feet for foot pain.       escitalopram 20 MG tablet   Commonly known as: LEXAPRO   Take 1 tablet (20 mg total)  by mouth daily. For depression and anxiety.       gabapentin 300 MG capsule   Commonly known as: NEURONTIN   Take 2 capsules (600 mg total) by mouth 4 (four) times daily -  before meals and at bedtime. For anxiety and mood stabilization as well as neuropathic pain.       hydrochlorothiazide 25 MG tablet   Commonly known as: HYDRODIURIL   Take 1 tablet (25 mg total) by mouth daily. For blood pressure.       iloperidone 4 MG Tabs     Commonly known as: FANAPT   Take 1 tablet (4 mg total) by mouth 2 (two) times daily in the am and at bedtime.. For auditory hallucinations.       multivitamin with minerals Tabs   Take 1 tablet by mouth daily. For nutritional supplementation.       pantoprazole 40 MG tablet   Commonly known as: PROTONIX   Take 1 tablet (40 mg total) by mouth at bedtime. For GERD.       potassium chloride SA 20 MEQ tablet   Commonly known as: K-DUR,KLOR-CON   Take 1 tablet (20 mEq total) by mouth daily. For hypokalemia.       thiamine 100 MG tablet   Take 1 tablet (100 mg total) by mouth daily. For nutritional supplementation.  Please take for 30 days.       traZODone 100 MG tablet   Commonly known as: DESYREL   Take 2 tablets (200 mg total) by mouth at bedtime. For sleep.        They were also enrolled in group counseling sessions and activities in which they participated actively.       Follow-up Information    Follow up with [facility] on 05/26/2012. (appt. @ 1:30pm for medication management.  Please follow-up with the Walk-in clinic on 05-21-12)    Contact information:   [address] [place] [phone] fax      Follow up with [facility] on 05/21/2012. (intake is scheduled for 05-21-12 at 10:30am.)    Contact information:   [address]  [place], [place] [address] [phone]         Upon discharge, patient adamantly denies suicidal, homicidal ideations, auditory, visual hallucinations and or delusional thinking. They left [facility] with all personal belongings via personal transportation in no apparent distress.  Consults:  Please see electronic medical record for details.  Significant Diagnostic Studies:  Please see electronic medical record for details.  Discharge Vitals:   Blood pressure 102/69, pulse 75, temperature 98.1 F (36.7 C), temperature source Oral, resp. rate 21, height 5' 8.5" (1.74 m), weight 101.606 kg (224 lb), last menstrual period 04/27/2012..  Mental Status  Exam: Demographic Factors:  Caucasian, Low socioeconomic status and Unemployed  Mental Status Per Nursing Assessment::  On Admission:  On Discharge: Time was spent today discussing with the patient her current symptoms. The patient states that she is continuing to sleep well without difficulty and reports a good appetite. She denies any significant feelings of sadness, anhedonia or depressed mood and adamantly denies any suicidal or homicidal ideations. She denies any auditory or visual hallucinations or delusional thinking and states the "noises" have continued to be resolved. She denies any anxiety symptoms today and denies any medication related side effects.  The patient has been accepted into [facility] for tomorrow but the patient and her boyfriend have now decided that they do not wish the patient to enter [facility]. They  are requesting that the patient be discharged today to outpatient follow up and this will be ordered.  Loss Factors:  No acute losses reported.  Historical Factors:  Long history of mental illness. Long history of substance abuse. Non-compliance with medications.  Risk Reduction Factors:  Good family support. Good access to healthcare.  Continued Clinical Symptoms:  Alcohol/Substance Abuse/Dependencies  More than one psychiatric diagnosis  Previous Psychiatric Diagnoses and Treatments  Schizoaffective Disorder - Depressed Type.  Current Mental Status Per Physician:  Diagnosis:  Axis I: Schizoaffective Disorder - Depressed Type.  Polysubstance Dependence - Including Opioids, Amphetamines, Benzodiazepines.  Axis II: Deferred.  Axis III: 1. Urinary Tract Infection.  Axis IV: Chronic Mental Illness. Chronic Substance Abuse Issues.  Axis V: GAF at time of admission approximately 30. Highest GAF in the past year approximately 50.  The patient was seen today and reports the following:  ADL's: Intact.  Sleep: The patient reports to sleeping well last night without difficulty.   Appetite: The patient reports a good appetite today.  Mild>(1-10) >Severe  Hopelessness (1-10): 0  Depression (1-10): 0  Anxiety (1-10): 0  Suicidal Ideation: The patient adamantly denies any suicidal ideations today.  Plan: No  Intent: No  Means: No  Homicidal Ideation: The patient adamantly denies any homicidal ideations today.  Plan: No  Intent: No.  Means: No  General Appearance/Behavior: The patient was friendly and cooperative today with this provider with no complaints.  Eye Contact: Good.  Speech: Appropriate in rate and volume with no pressuring of speech noted today.  Motor Behavior: wnl.  Level of Consciousness: Alert and Oriented x 3.  Mental Status: Alert and Oriented x 3.  Mood: Essentially Euthymic.  Affect: Essentially Full today.  Anxiety Level: No anxiety reported today.  Thought Process: wnl.  Thought Content: The patient denies any current auditory or visual hallucinations or delusional thinking.  Perception: wnl.  Judgment: Fair to good.  Insight: Fair to good.  Cognition: Oriented to person, place and time.  Discharge Diagnoses:  Axis I: Schizoaffective Disorder - Depressed Type.  Polysubstance Dependence - Including Opioids, Amphetamines, Benzodiazepines.  Axis II: Deferred.  Axis III: 1. Urinary Tract Infection.  Axis IV: Chronic Mental Illness. Chronic Substance Abuse Issues.  Axis V: GAF at time of admission approximately 30. Highest GAF in the past year approximately 50.  Cognitive Features That Contribute To Risk:  Polarized thinking  Current Medications:  Current Facility-Administered Medications   Medication  Dose  Route  Frequency  Provider  Last Rate  Last Dose   .  acetaminophen (TYLENOL) tablet 650 mg  650 mg  Oral  Q6H PRN  Kerry Hough, PA   650 mg at 05/19/12 4098   .  alum & mag hydroxide-simeth (MAALOX/MYLANTA) 200-200-20 MG/5ML suspension 30 mL  30 mL  Oral  Q4H PRN  Curlene Labrum Rishan Oyama, MD   30 mL at 05/15/12 1937   .   chlordiazePOXIDE (LIBRIUM) 25 MG capsule         .  ciprofloxacin (CIPRO) tablet 500 mg  500 mg  Oral  BID  Curlene Labrum Terran Klinke, MD   500 mg at 05/18/12 2138   .  diclofenac sodium (VOLTAREN) 1 % transdermal gel 4 g  4 g  Topical  BH-qamhs  Curlene Labrum Venice Liz, MD   4 g at 05/19/12 0749   .  escitalopram (LEXAPRO) tablet 20 mg  20 mg  Oral  Daily  Kerry Hough, PA   20 mg at  05/19/12 0749   .  gabapentin (NEURONTIN) capsule 600 mg  600 mg  Oral  TID AC & HS  Curlene Labrum Desiree Daise, MD   600 mg at 05/19/12 1144   .  hydrochlorothiazide (HYDRODIURIL) tablet 25 mg  25 mg  Oral  Daily  Kerry Hough, PA   25 mg at 05/19/12 0749   .  hydrOXYzine (ATARAX/VISTARIL) tablet 50 mg  50 mg  Oral  Q6H PRN  Ronny Bacon, MD   50 mg at 05/19/12 0751   .  iloperidone (FANAPT) tablet 4 mg  4 mg  Oral  BH-qamhs  Curlene Labrum Demerius Podolak, MD   4 mg at 05/19/12 0748   .  magnesium hydroxide (MILK OF MAGNESIA) suspension 30 mL  30 mL  Oral  Daily PRN  Kerry Hough, PA     .  multivitamin with minerals tablet 1 tablet  1 tablet  Oral  Daily  Ronny Bacon, MD   1 tablet at 05/19/12 0748   .  pantoprazole (PROTONIX) EC tablet 40 mg  40 mg  Oral  QHS  Curlene Labrum Sanaia Jasso, MD   40 mg at 05/18/12 2138   .  potassium chloride SA (K-DUR,KLOR-CON) CR tablet 20 mEq  20 mEq  Oral  Daily  Kerry Hough, PA   20 mEq at 05/19/12 0749   .  thiamine (VITAMIN B-1) tablet 100 mg  100 mg  Oral  Daily  Curlene Labrum Marvon Shillingburg, MD   100 mg at 05/19/12 0748   .  traZODone (DESYREL) tablet 200 mg  200 mg  Oral  QHS  Kerry Hough, PA   200 mg at 05/18/12 2137   .  DISCONTD: iloperidone (FANAPT) tablet 4 mg  4 mg  Oral  BID  Verne Spurr, PA-C   4 mg at 05/18/12 1610   .  DISCONTD: traZODone (DESYREL) tablet 150 mg  150 mg  Oral  QHS PRN  Ronny Bacon, MD     .  DISCONTD: traZODone (DESYREL) tablet 50 mg  50 mg  Oral  QHS PRN  Kerry Hough, PA   50 mg at 05/16/12 2128   Lab Results: No results found for this or any previous visit (from the past  48 hour(s)).  Physical Findings:  AIMS: Facial and Oral Movements  Muscles of Facial Expression: None, normal  Lips and Perioral Area: None, normal  Jaw: None, normal  Tongue: None, normal,Extremity Movements  Upper (arms, wrists, hands, fingers): None, normal  Lower (legs, knees, ankles, toes): None, normal, Trunk Movements  Neck, shoulders, hips: None, normal, Overall Severity  Severity of abnormal movements (highest score from questions above): None, normal  Incapacitation due to abnormal movements: None, normal  Patient's awareness of abnormal movements (rate only patient's report): No Awareness, Dental Status  Current problems with teeth and/or dentures?: No  Does patient usually wear dentures?: No  CIWA: CIWA-Ar Total: 1  COWS: COWS Total Score: 1  Review of Systems:  Neurological: No headaches, seizures or dizziness reported.  G.I.: The patient reports significant G.I. Upset today.  Musculoskeletal: The patient denies any musculoskeletal issues today.  Extremities: Chronic bilateral foot pain which is much improved today.  Time was spent today discussing with the patient her current symptoms. The patient states that she is continuing to sleep well without difficulty and reports a good appetite. She denies any significant feelings of sadness, anhedonia or depressed mood and adamantly denies any suicidal or homicidal ideations.  She denies any auditory or visual hallucinations or delusional thinking and states the "noises" have continued to be resolved. She denies any anxiety symptoms today and denies any medication related side effects.  The patient has been accepted into [facility] for tomorrow but the patient and her boyfriend have now decided that they do not wish the patient to enter [facility]. They are requesting that the patient be discharged today to outpatient follow up and this will be ordered.  Treatment Plan Summary:  1. Daily contact with patient to assess and evaluate symptoms and  progress in treatment.  2. Medication management  3. The patient will deny suicidal ideations or homicidal ideations for 48 hours prior to discharge and have a depression and anxiety rating of 3 or less. The patient will also deny any auditory or visual hallucinations or delusional thinking.  4. The patient will deny any symptoms of substance withdrawal at time of discharge.  Plan:  1. Will continue the patient on the medication Lexapro 20 mgs po q am for depression and anxiety.  2. Will continue the medication Neurontin at 600 mgs po TID - AC and hs for anxiety.  3. Will continue the patient on the medication Trazodone at 150 mgs po qhs for sleep.  4. Will continue the patient on the medication Fanapt 4 mgs po BID for psychosis.  5. Will continue the patient on the medication Vistaril at 50 mgs po q 8 hours - prn for anxiety.  6. Will continue the patient on the medication Voltaren 1% gel to apply topically q am and hs for chronic foot pain.  7. Will continue the patient on her non-psychiatric medications as listed above.  8. Laboratory Studies reviewed.  9. Will continue to monitor.  10. The patient will be discharged today to outpatient follow up as requested.  Suicide Risk:  Minimal: No identifiable suicidal ideation. Patients presenting with no risk factors but with morbid ruminations; may be classified as minimal risk based on the severity of the depressive symptoms  Plan Of Care/Follow-up recommendations:  Activity: As tolerated.  Diet: Regular Diet.  Other: Please take all medications only as directed and keep all scheduled follow up appointments. Please abstain from any use of alcohol or illicit drugs. Please return to your local Emergency Room should you have any thoughts of harming yourself or others.  Discharge destination:  Home  Is patient on multiple antipsychotic therapies at discharge:  No  Has Patient had three or more failed trials of antipsychotic monotherapy by history:  N/A Recommended Plan for Multiple Antipsychotic Therapies: N/A Discharge Orders    Future Orders Please Complete By Expires   Diet - low sodium heart healthy      Increase activity slowly      Discharge instructions      Comments:   Please take all medications only as directed and keep all scheduled follow up appointments.  Please abstain from any use of alcohol or illicit drugs.  Also please note that you were offered admission at [facility] for tomorrow but declined.  Also please return to your local Emergency Room should you have any thoughts of harming yourself or others.       Medication List     As of 06/07/2012  9:05 PM    STOP taking these medications         acetaminophen 500 MG tablet   Commonly known as: TYLENOL      diazepam 10 MG tablet   Commonly known as: VALIUM  diclofenac 75 MG EC tablet   Commonly known as: VOLTAREN      TAKE these medications      Indication    diclofenac sodium 1 % Gel   Commonly known as: VOLTAREN   Apply 4 g topically 2 (two) times daily in the am and at bedtime.. To bottoms of feet for foot pain.       escitalopram 20 MG tablet   Commonly known as: LEXAPRO   Take 1 tablet (20 mg total) by mouth daily. For depression and anxiety.       gabapentin 300 MG capsule   Commonly known as: NEURONTIN   Take 2 capsules (600 mg total) by mouth 4 (four) times daily -  before meals and at bedtime. For anxiety and mood stabilization as well as neuropathic pain.       hydrochlorothiazide 25 MG tablet   Commonly known as: HYDRODIURIL   Take 1 tablet (25 mg total) by mouth daily. For blood pressure.       iloperidone 4 MG Tabs   Commonly known as: FANAPT   Take 1 tablet (4 mg total) by mouth 2 (two) times daily in the am and at bedtime.. For auditory hallucinations.       multivitamin with minerals Tabs   Take 1 tablet by mouth daily. For nutritional supplementation.       pantoprazole 40 MG tablet   Commonly known as: PROTONIX   Take 1  tablet (40 mg total) by mouth at bedtime. For GERD.       potassium chloride SA 20 MEQ tablet   Commonly known as: K-DUR,KLOR-CON   Take 1 tablet (20 mEq total) by mouth daily. For hypokalemia.       thiamine 100 MG tablet   Take 1 tablet (100 mg total) by mouth daily. For nutritional supplementation.  Please take for 30 days.       traZODone 100 MG tablet   Commonly known as: DESYREL   Take 2 tablets (200 mg total) by mouth at bedtime. For sleep.            Follow-up Information    Follow up with [facility] on 05/26/2012. (appt. @ 1:30pm for medication management.  Please follow-up with the Walk-in clinic on 05-21-12)    Contact information:   [address] [place] [phone] fax      Follow up with [facility] on 05/21/2012. (intake is scheduled for 05-21-12 at 10:30am.)    Contact information:   [address]  [place], [place] [address] [phone]        Follow-up recommendations:   Activities: Resume typical activities Diet: Resume typical diet Other: Follow up with outpatient provider and report any side effects to out patient prescriber.  Comments:  Take all your medications as prescribed by your mental healthcare provider. Report any adverse effects and or reactions from your medicines to your outpatient provider promptly. Patient is instructed and cautioned to not engage in alcohol and or illegal drug use while on prescription medicines. In the event of worsening symptoms, patient is instructed to call the crisis hotline, 911 and or go to the nearest ED for appropriate evaluation and treatment of symptoms. Follow-up with your primary care provider for your other medical issues, concerns and or health care needs.  Signed: Franchot Gallo 06/07/2012 9:05 PM

## 2012-06-30 ENCOUNTER — Other Ambulatory Visit (HOSPITAL_COMMUNITY): Payer: Self-pay
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
## 2013-07-20 IMAGING — CR DG HIP COMPLETE 2+V*R*
3 series · 3 of 3 positions shown · non-contrast
Comparison: None.

CLINICAL DATA: Fall, right hip pain

RIGHT HIP - COMPLETE 2+ VIEW

[x pelvis]
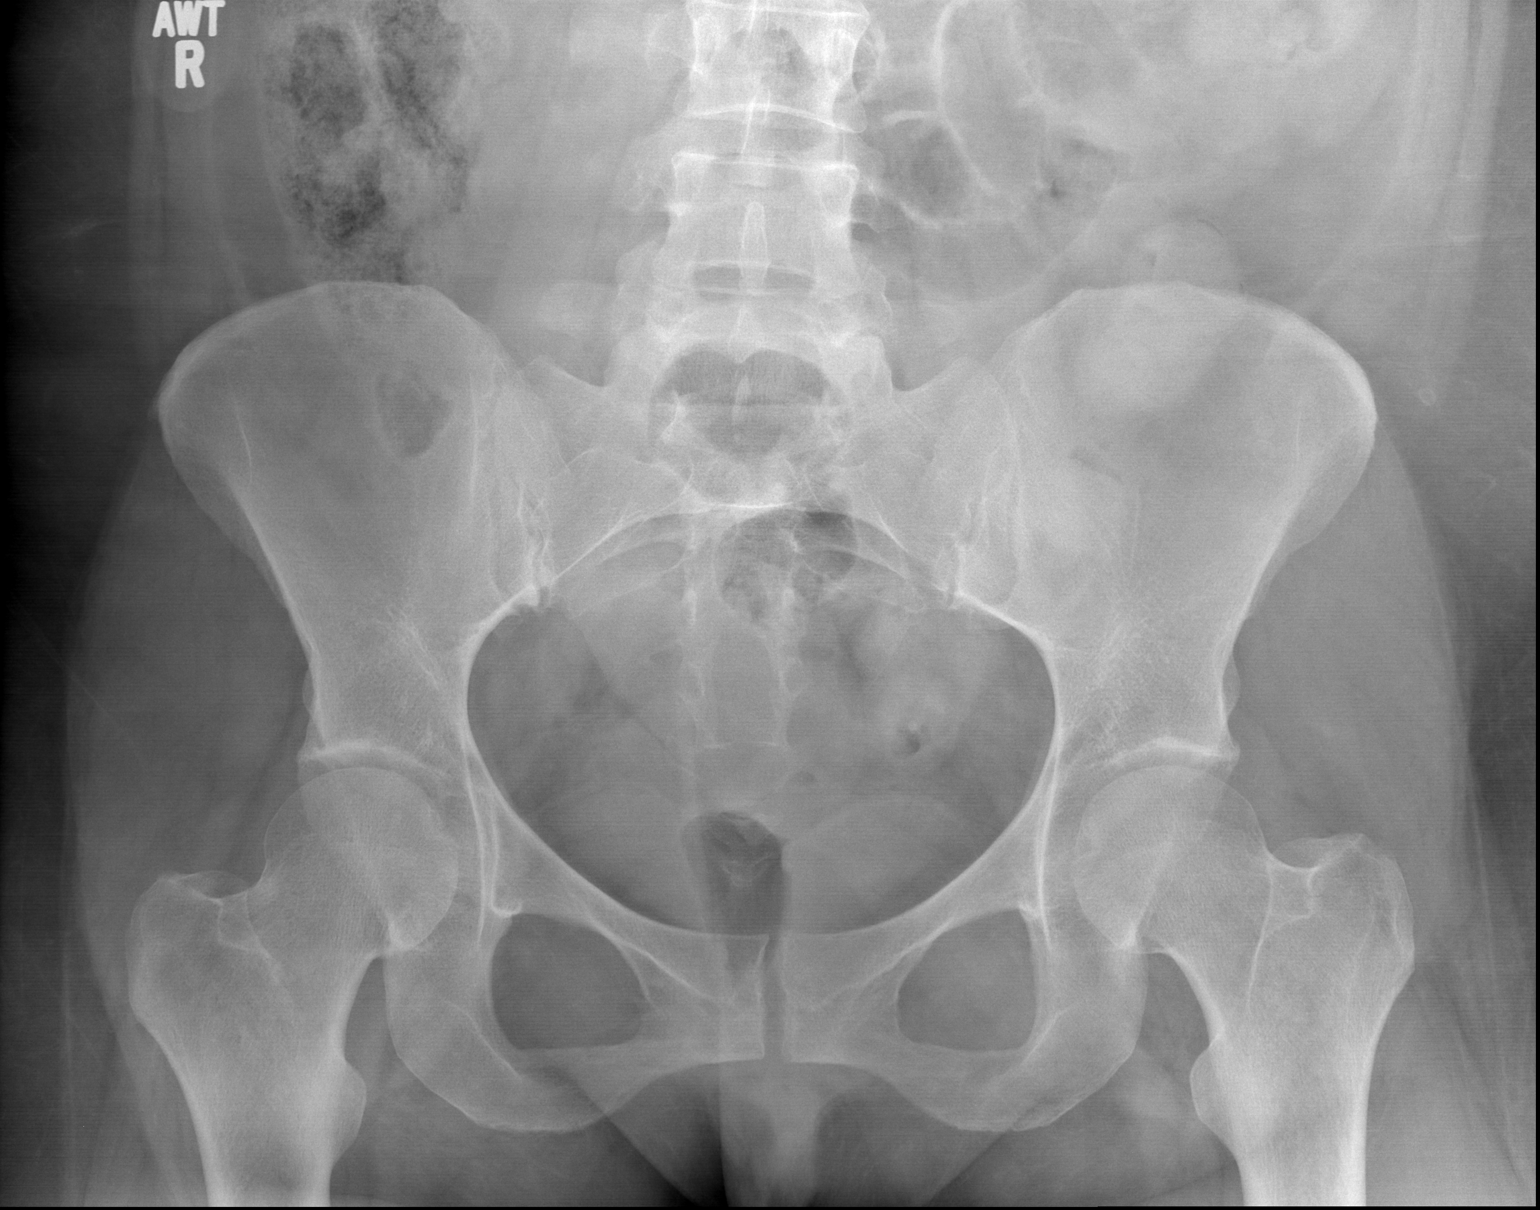

[x hip ap right]
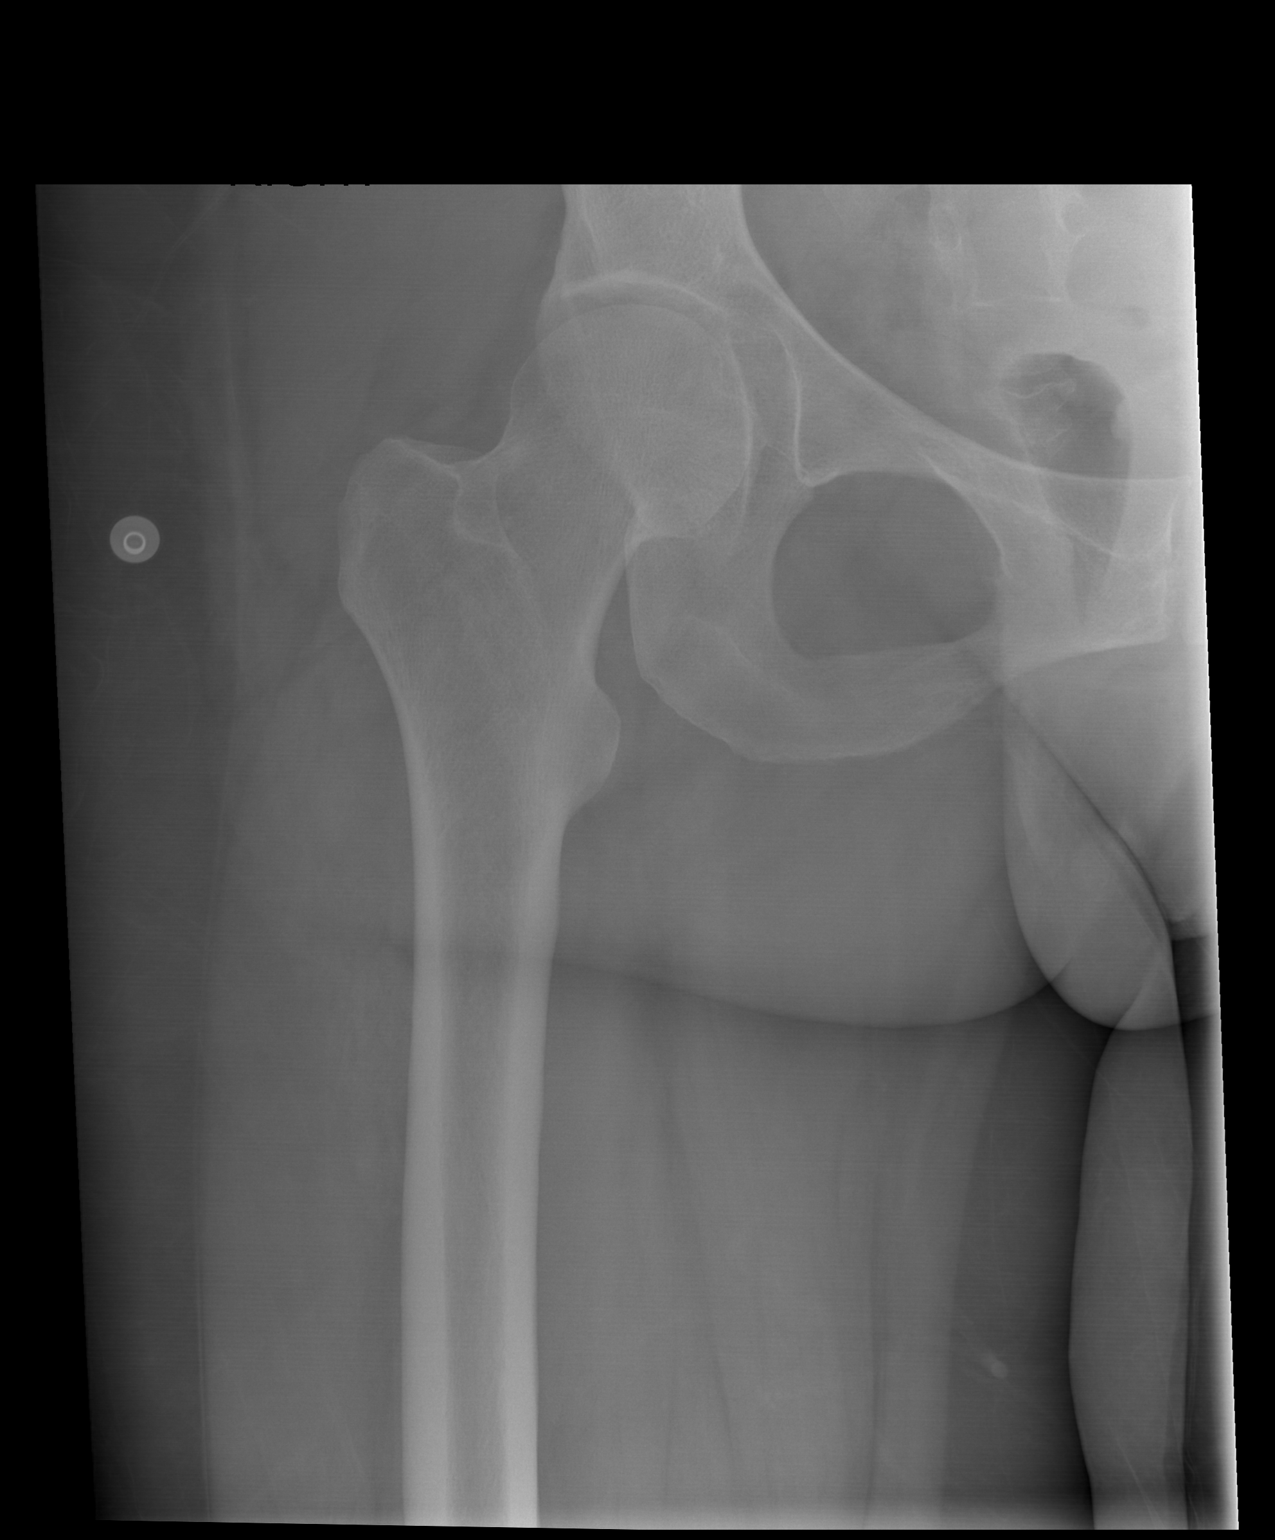

[x hip lat right]
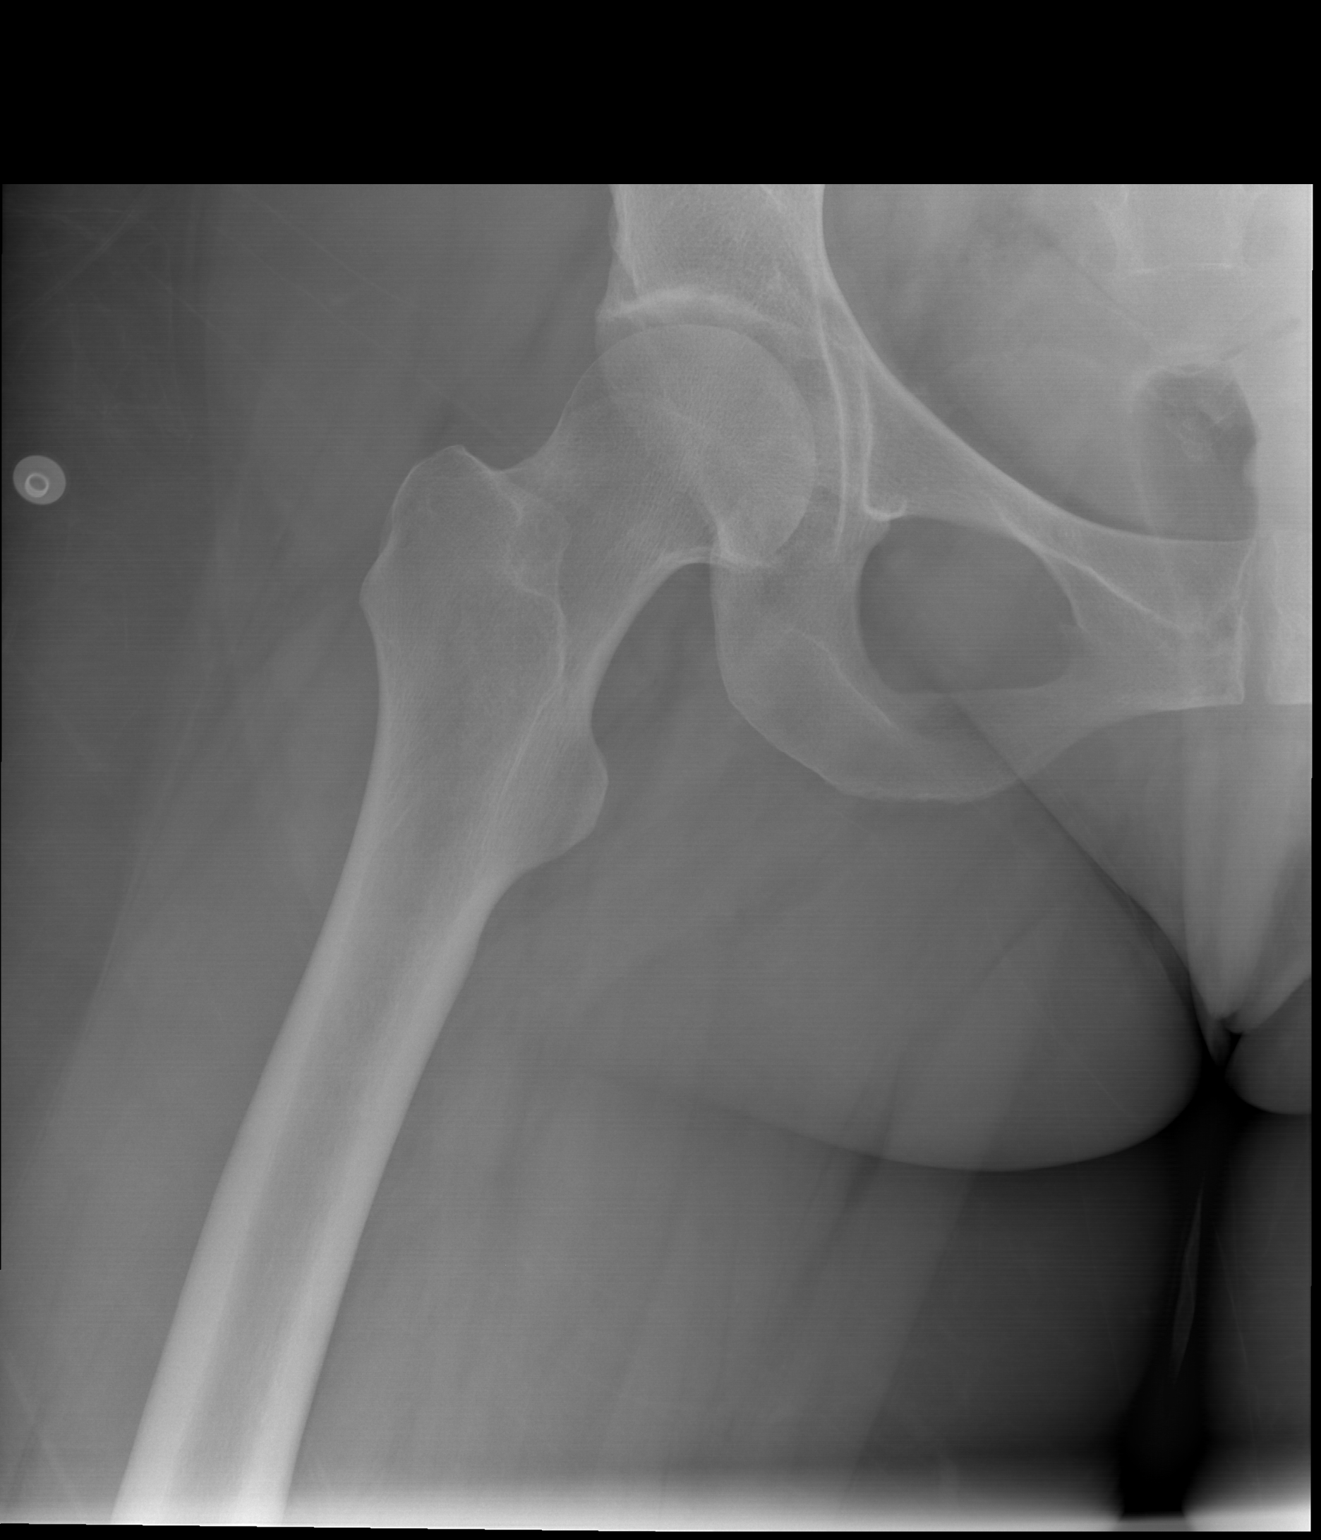

[3 of 3 positions shown; findings below may reference images not displayed]

FINDINGS: Intact right hip.  No fracture evident.  Normal
alignment.  Pelvis unremarkable.  No diastasis.  Normal SI joints.
IMPRESSION: No acute osseous finding.

## 2013-11-19 IMAGING — US US TRANSVAGINAL NON-OB
1 series · 13 of 25 positions shown · non-contrast
Comparison: 04/03/2012, 02/17/2012

CLINICAL DATA: Right lower quadrant pain, history of right
hemorrhagic ovarian cyst.

TRANSVAGINAL ULTRASOUND OF PELVIS
TECHNIQUE: Transvaginal ultrasound examination of the pelvis was
performed including evaluation of the uterus, ovaries, adnexal
regions, and pelvic cul-de-sac.

[Series 1: us transvaginal non-ob · 13 of 34 slices shown]
[im 1/34]
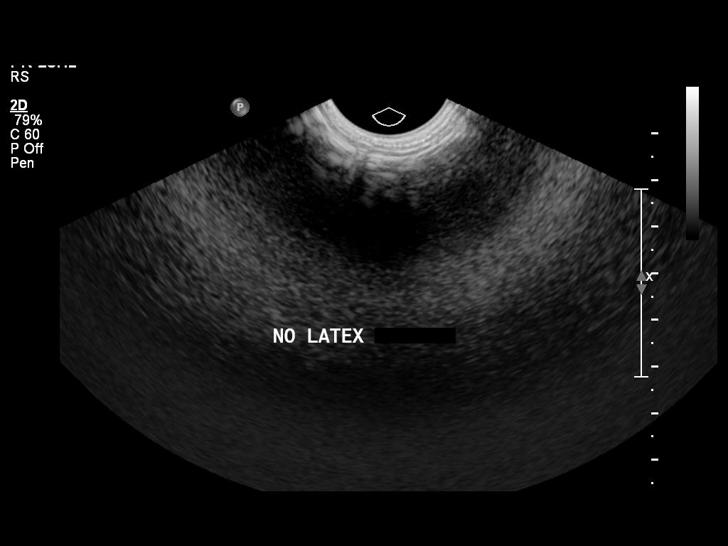
[im 3/34]
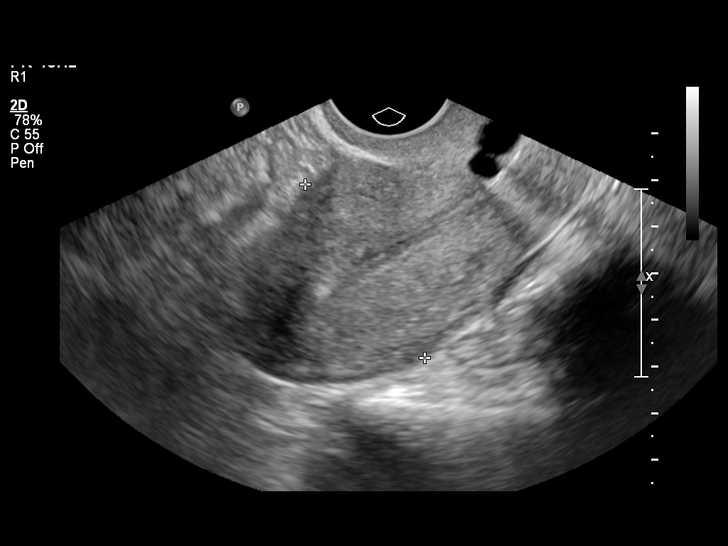
[im 6/34]
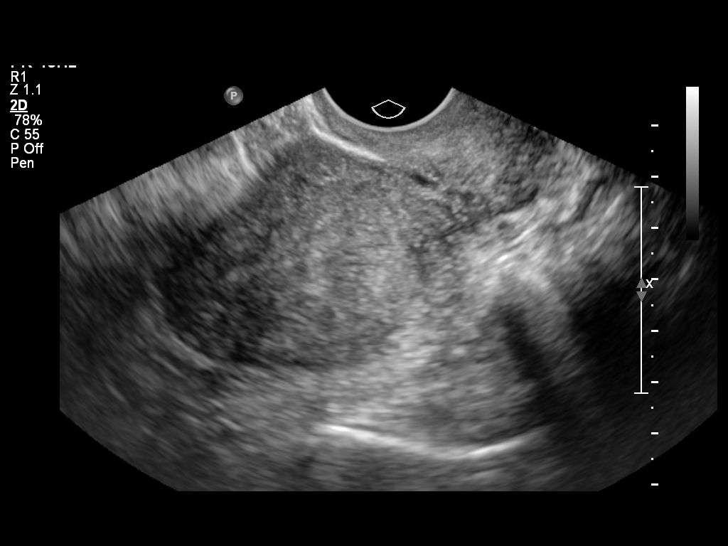
[im 9/34]
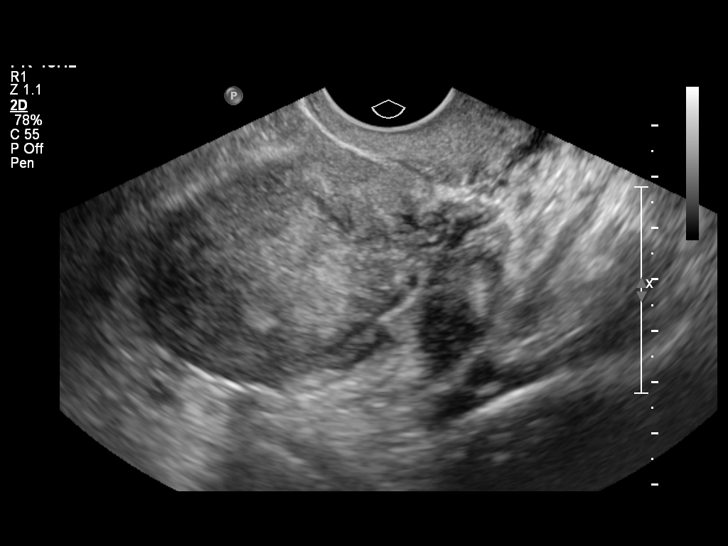
[im 12/34]
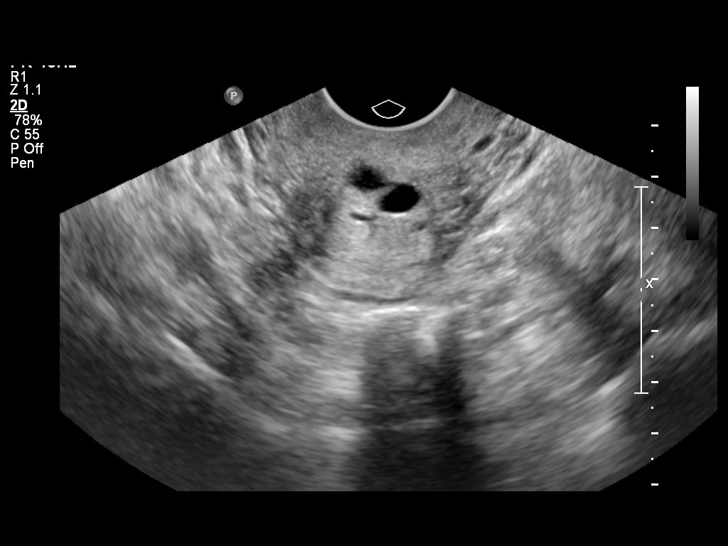
[im 14/34]
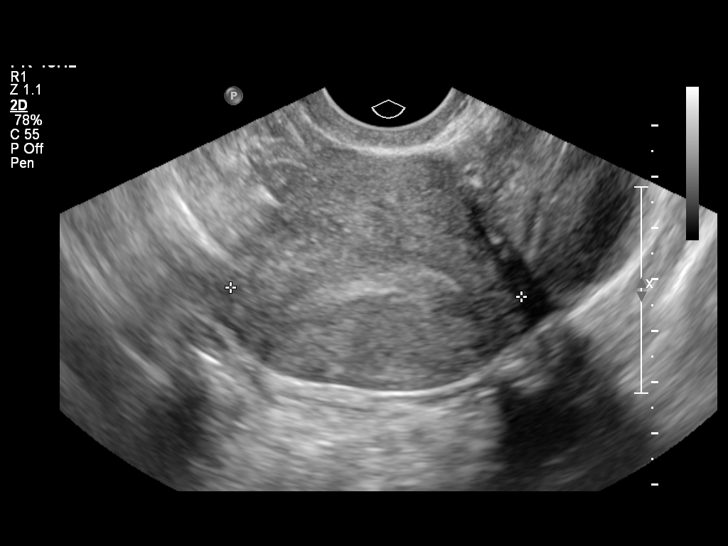
[im 17/34]
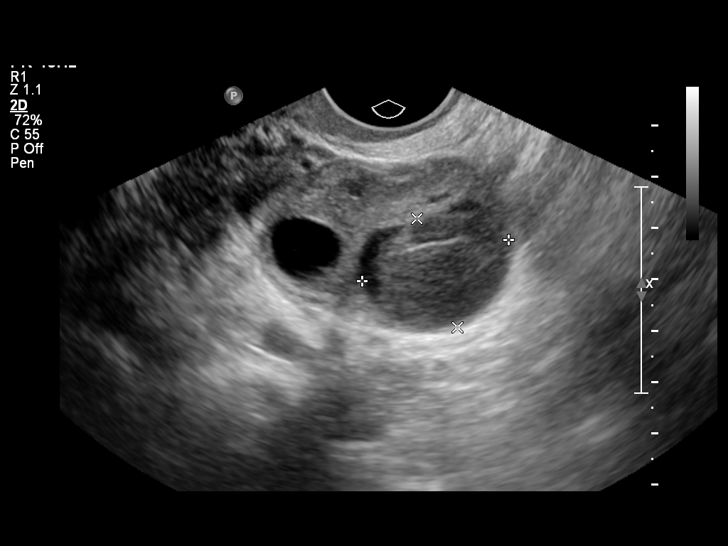
[im 20/34]
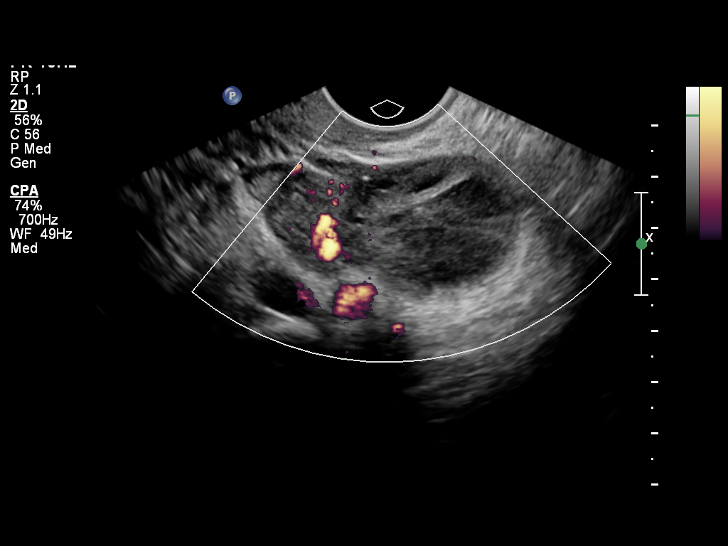
[im 23/34]
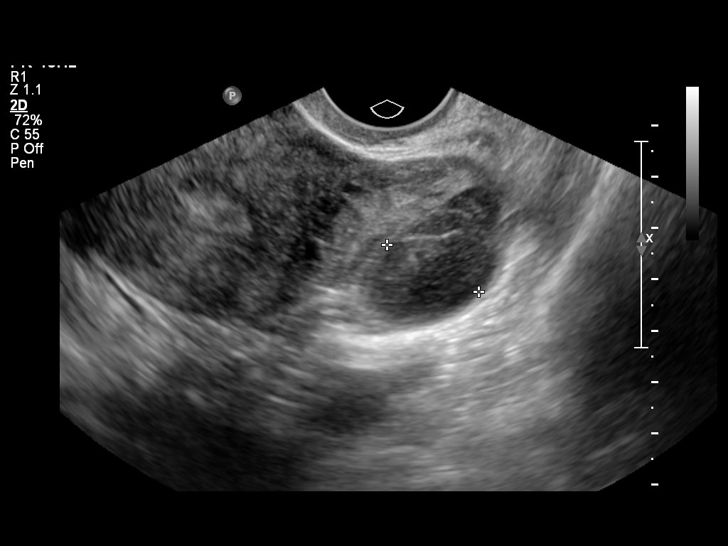
[im 25/34]
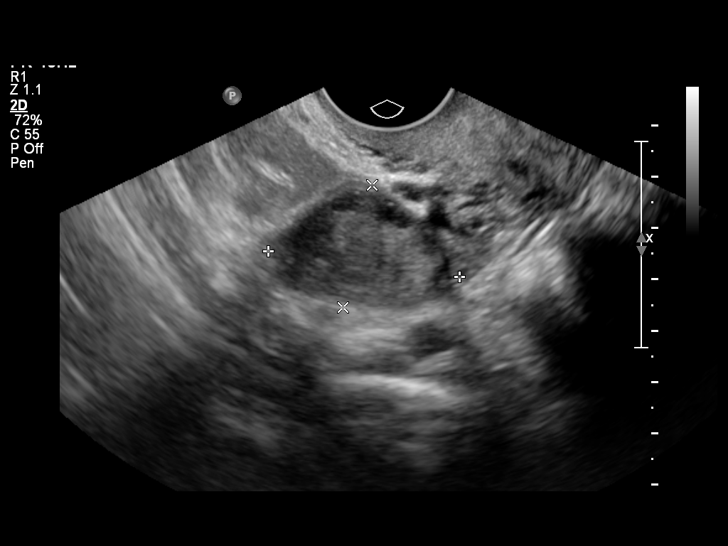
[im 28/34]
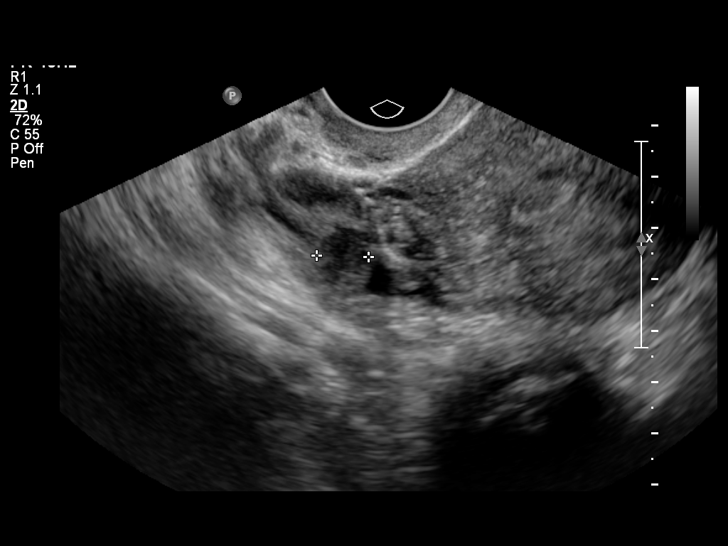
[im 31/34]
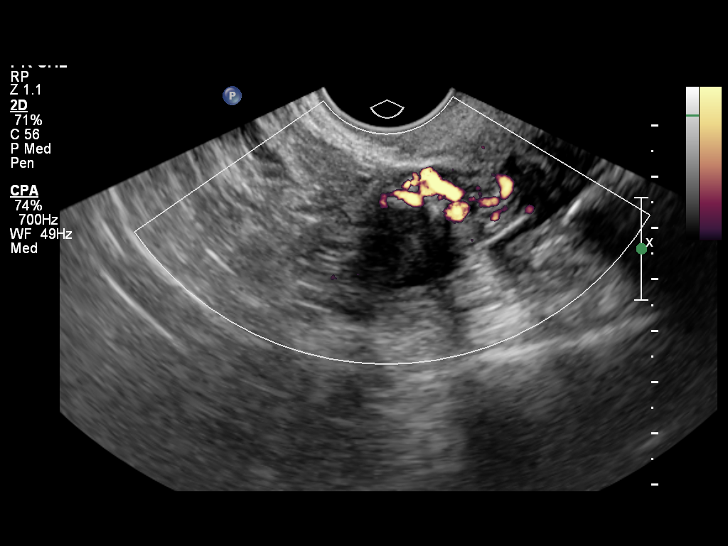
[im 34/34]
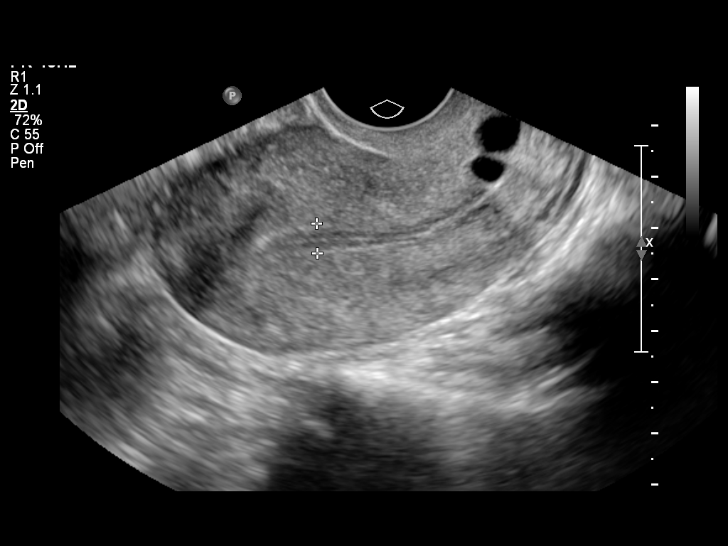

[13 of 25 positions shown; findings below may reference images not displayed]

FINDINGS: Uterus:  Normal in size and appearance.  Uterus measures 9.4 x
x 5.7 cm.  No focal contour abnormality or fibroid.  Cervical
Nabothian cyst noted.

Endometrium: Normal thickness and appearance.  Five point mm AP
thickness.

Right ovary: Measures 3.7 x 2.4 x 1.8 cm.  Right ovary contains a
focal hypoechoic area measuring 1.2 cm in greatest diameter,
nonspecific but could represent a small collapsing hemorrhagic cyst
or follicle.  This is significantly smaller than the 02/17/2012
comparison.

Left ovary: Measures 5.1 x 3.5 x 2.6 cm.  Left ovary contains a
complex hemorrhagic debris filled cyst measuring 3.0 x 2.3 x 2 cm,
new since 02/17/2012..

Other Findings:  No free fluid
IMPRESSION: Resolving right complex hemorrhagic cyst.

Interval development of a left complex hemorrhagic 3 cm cyst

No free fluid

Normal uterus and endometrium

## 2014-03-25 ENCOUNTER — Encounter (HOSPITAL_COMMUNITY): Payer: Self-pay | Admitting: Emergency Medicine

## 2014-03-25 ENCOUNTER — Emergency Department (HOSPITAL_COMMUNITY)
Admission: EM | Admit: 2014-03-25 | Discharge: 2014-03-25 | Discharge status: Against Medical Advice | Payer: No Typology Code available for payment source
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
## 2014-07-18 ENCOUNTER — Encounter (HOSPITAL_COMMUNITY): Payer: Self-pay | Admitting: Emergency Medicine
